# Patient Record
Sex: Female | Born: 1990 | Race: Black or African American | Hispanic: No | Marital: Single | Smoking: Former smoker
Health system: Southern US, Community
[De-identification: ages and names within clinical notes are randomized; demographics above are authoritative.]

## PROBLEM LIST (undated history)

## (undated) DIAGNOSIS — N83209 Unspecified ovarian cyst, unspecified side: Secondary | ICD-10-CM

## (undated) DIAGNOSIS — N760 Acute vaginitis: Secondary | ICD-10-CM

## (undated) DIAGNOSIS — B9689 Other specified bacterial agents as the cause of diseases classified elsewhere: Secondary | ICD-10-CM

## (undated) DIAGNOSIS — K219 Gastro-esophageal reflux disease without esophagitis: Secondary | ICD-10-CM

## (undated) HISTORY — PX: ESOPHAGEAL DILATION: SHX303

---

## 2012-05-25 ENCOUNTER — Encounter (HOSPITAL_COMMUNITY): Payer: Self-pay | Admitting: *Deleted

## 2012-05-25 ENCOUNTER — Emergency Department (HOSPITAL_COMMUNITY)
Admission: EM | Admit: 2012-05-25 | Discharge: 2012-05-25 | Disposition: A | Discharge status: Home / Self Care | Payer: Self-pay | Attending: Emergency Medicine | Admitting: Emergency Medicine

## 2012-05-25 DIAGNOSIS — F17200 Nicotine dependence, unspecified, uncomplicated: Secondary | ICD-10-CM | POA: Insufficient documentation

## 2012-05-25 DIAGNOSIS — Z3202 Encounter for pregnancy test, result negative: Secondary | ICD-10-CM | POA: Insufficient documentation

## 2012-05-25 DIAGNOSIS — R112 Nausea with vomiting, unspecified: Secondary | ICD-10-CM | POA: Insufficient documentation

## 2012-05-25 DIAGNOSIS — R51 Headache: Secondary | ICD-10-CM | POA: Insufficient documentation

## 2012-05-25 DIAGNOSIS — Z8719 Personal history of other diseases of the digestive system: Secondary | ICD-10-CM | POA: Insufficient documentation

## 2012-05-25 HISTORY — DX: Gastro-esophageal reflux disease without esophagitis: K21.9

## 2012-05-25 LAB — URINALYSIS, ROUTINE W REFLEX MICROSCOPIC
Nitrite: NEGATIVE
Protein, ur: NEGATIVE mg/dL
Specific Gravity, Urine: 1.025 (ref 1.005–1.030)
Urobilinogen, UA: 0.2 mg/dL (ref 0.0–1.0)

## 2012-05-25 LAB — POCT PREGNANCY, URINE: Preg Test, Ur: NEGATIVE

## 2012-05-25 LAB — POCT I-STAT, CHEM 8
Calcium, Ion: 1.19 mmol/L (ref 1.12–1.23)
Hemoglobin: 12.6 g/dL (ref 12.0–15.0)
Sodium: 143 mEq/L (ref 135–145)
TCO2: 29 mmol/L (ref 0–100)

## 2012-05-25 MED ORDER — PROMETHAZINE HCL 50 MG PO TABS: 50.0000 mg | ORAL_TABLET | Freq: Four times a day (QID) | ORAL | Status: DC | PRN

## 2012-05-25 MED ORDER — ONDANSETRON 4 MG PO TBDP
4.0000 mg | ORAL_TABLET | Freq: Once | ORAL | Status: AC
  Administered 2012-05-25: 4 mg via ORAL
  Filled 2012-05-25: qty 1

## 2012-05-25 MED ORDER — MORPHINE SULFATE 4 MG/ML IJ SOLN
4.0000 mg | Freq: Once | INTRAMUSCULAR | Status: AC
  Administered 2012-05-25: 4 mg via INTRAVENOUS
  Filled 2012-05-25: qty 1

## 2012-05-25 MED ORDER — SODIUM CHLORIDE 0.9 % IV BOLUS (SEPSIS)
1000.0000 mL | Freq: Once | INTRAVENOUS | Status: AC
  Administered 2012-05-25: 1000 mL via INTRAVENOUS

## 2012-05-25 MED ORDER — ONDANSETRON 4 MG PO TBDP
8.0000 mg | ORAL_TABLET | Freq: Once | ORAL | Status: AC
  Administered 2012-05-25: 8 mg via ORAL
  Filled 2012-05-25: qty 2

## 2012-05-25 NOTE — ED Notes (Signed)
Nicole PA at bedside   

## 2012-05-25 NOTE — ED Provider Notes (Signed)
History     CSN: [id number]  Arrival date & time 05/25/12  1952   First MD Initiated Contact with Patient 05/25/12 2204      Chief Complaint  Patient presents with  . Emesis    (Consider location/radiation/quality/duration/timing/severity/associated sxs/prior treatment) HPI  Bonnie Stevenson is a 22 y.o. female complaining of 3 episodes of nonbloody, nonbilious, non-coffee ground appearing emesis starting at 3 PM today. Patient went to eat at a [place] buffet in both her and her friend has had nausea and vomiting since that time. Patient denies abdominal pain, fever, diarrhea. She does endorse a mild to moderate left frontal headache. Patient has had fluids to drink since last episode of has been able to keep them down.   Past Medical History  Diagnosis Date  . GERD (gastroesophageal reflux disease)     Past Surgical History  Procedure Laterality Date  . Esophageal dilation      History reviewed. No pertinent family history.  History  Substance Use Topics  . Smoking status: Current Every Day Smoker -- 0.50 packs/day    Types: Cigarettes  . Smokeless tobacco: Not on file  . Alcohol Use: Yes    OB History   Grav Para Term Preterm Abortions TAB SAB Ect Mult Living                  Review of Systems  Constitutional: Negative for fever.  Respiratory: Negative for shortness of breath.   Cardiovascular: Negative for chest pain.  Gastrointestinal: Positive for nausea and vomiting. Negative for abdominal pain and diarrhea.  Neurological: Positive for headaches.  All other systems reviewed and are negative.    Allergies  Other  Home Medications  No current outpatient prescriptions on file.  BP 124/66  Pulse 83  Temp(Src) 98.3 F (36.8 C) (Oral)  Resp 16  SpO2 100%  LMP 05/23/2012  Physical Exam  Nursing note and vitals reviewed. Constitutional: She is oriented to person, place, and time. She appears well-developed and well-nourished. No distress.   HENT:  Head: Normocephalic.  Mouth/Throat: Oropharynx is clear and moist.  Eyes: Conjunctivae and EOM are normal. Pupils are equal, round, and reactive to light.  Cardiovascular: Normal rate, regular rhythm and intact distal pulses.   Pulmonary/Chest: Effort normal and breath sounds normal. No stridor. No respiratory distress. She has no wheezes. She has no rales. She exhibits no tenderness.  Abdominal: Soft. Bowel sounds are normal. She exhibits no distension and no mass. There is no tenderness. There is no rebound.  Musculoskeletal: Normal range of motion.  Neurological: She is alert and oriented to person, place, and time.  Psychiatric: She has a normal mood and affect.    ED Course  Procedures (including critical care time)  Labs Reviewed  URINALYSIS, ROUTINE W REFLEX MICROSCOPIC  POCT PREGNANCY, URINE  POCT I-STAT, CHEM 8   No results found.   1. Nausea and vomiting       MDM    Bonnie Stevenson is a 22 y.o. female several episodes of isolated emesis. Likely food poisoning as patient's friend who also died with her has similar symptoms. Abdominal exam is benign with no tenderness to palpation, guarding or rebound Patient is tolerating by mouth at this time.  I-STAT shows no electrolyte abnormalities. Patient will be rehydrated and pain control.   Filed Vitals:   05/25/12 2007  BP: 124/66  Pulse: 83  Temp: 98.3 F (36.8 C)  TempSrc: Oral  Resp: 16  SpO2: 100%  Pt verbalized understanding and agrees with care plan. Outpatient follow-up and return precautions given.    New Prescriptions   PROMETHAZINE (PHENERGAN) 50 MG TABLET    Take 1 tablet (50 mg total) by mouth every 6 (six) hours as needed for nausea.           Wynetta Emery, PA-C 05/25/12 2332

## 2012-05-25 NOTE — ED Notes (Signed)
Pt states she ate some chinese today from a buffet, since has had n/v.  Vomited approx 3 times since 3 pm.  States she has GERD, but this is different.  Worried about food poisoning.

## 2012-05-25 NOTE — ED Provider Notes (Signed)
Medical screening examination/treatment/procedure(s) were performed by non-physician practitioner and as supervising physician I was immediately available for consultation/collaboration.  Ethelda Chick, MD 05/25/12 [phone]

## 2012-05-25 NOTE — ED Notes (Addendum)
Pt states she went out to eat at a chinese buffet and began having nausea/vomiting and headache shortly afterwards. Pt states she has some sharp pains to left side as well but unsure if it was menstrual pain or pain from other source. Rates headache 6/10.

## 2012-06-06 ENCOUNTER — Encounter (HOSPITAL_COMMUNITY): Payer: Self-pay | Admitting: *Deleted

## 2012-06-06 ENCOUNTER — Emergency Department (HOSPITAL_COMMUNITY)
Admission: EM | Admit: 2012-06-06 | Discharge: 2012-06-06 | Disposition: A | Discharge status: Home / Self Care | Payer: Self-pay | Attending: Emergency Medicine | Admitting: Emergency Medicine

## 2012-06-06 DIAGNOSIS — L309 Dermatitis, unspecified: Secondary | ICD-10-CM

## 2012-06-06 DIAGNOSIS — L259 Unspecified contact dermatitis, unspecified cause: Secondary | ICD-10-CM | POA: Insufficient documentation

## 2012-06-06 DIAGNOSIS — Z8719 Personal history of other diseases of the digestive system: Secondary | ICD-10-CM | POA: Insufficient documentation

## 2012-06-06 DIAGNOSIS — F17200 Nicotine dependence, unspecified, uncomplicated: Secondary | ICD-10-CM | POA: Insufficient documentation

## 2012-06-06 NOTE — ED Provider Notes (Signed)
History    This chart was scribed for non-physician practitioner Jaci Carrel, PA-C working with Raeford Razor, MD by Gerlean Ren, ED Scribe. This patient was seen in room TR09C/TR09C and the patient's care was started at .   CSN: [id number]  Arrival date & time 06/06/12  1908   None     Chief Complaint  Patient presents with  . Rash     The history is provided by the patient. No language interpreter was used.  Bonnie Stevenson is a 22 y.o. female who presents to the Emergency Department complaining of an itching rash over her left side face that began yesterday.  Pt reports she used new soap for the first time yesterday.  No other new exposures, soaps.   Past Medical History  Diagnosis Date  . GERD (gastroesophageal reflux disease)     Past Surgical History  Procedure Laterality Date  . Esophageal dilation      No family history on file.  History  Substance Use Topics  . Smoking status: Current Every Day Smoker -- 0.50 packs/day    Types: Cigarettes  . Smokeless tobacco: Not on file  . Alcohol Use: Yes    No OB history provided.   Review of Systems See HPI. Allergies  Other  Home Medications  No current outpatient prescriptions on file.  BP 117/57  Temp(Src) 98.4 F (36.9 C) (Oral)  Resp 18  SpO2 98%  LMP 05/23/2012  Physical Exam  Nursing note and vitals reviewed. Constitutional: She is oriented to person, place, and time. She appears well-developed and well-nourished. No distress.  HENT:  Head: Normocephalic and atraumatic.  Eyes: Conjunctivae and EOM are normal.  Neck: Normal range of motion.  Pulmonary/Chest: Effort normal.  Musculoskeletal: Normal range of motion.  Neurological: She is alert and oriented to person, place, and time.  Skin: Skin is warm and dry. Rash noted. She is not diaphoretic.  Small mild superficial bumps on face bilaterally. No crusting, weeping, or erythema  Psychiatric: She has a normal mood and affect. Her behavior  is normal.    ED Course  Procedures (including critical care time) DIAGNOSTIC STUDIES: Oxygen Saturation is 98% on room air, normal by my interpretation.    COORDINATION OF CARE: 9:27 PM- Informed pt to treat rash with oatmeal mask at home and to avoid using most recent soap again.  Pt understands and agrees with plan.      No diagnosis found.    MDM  dermatitis Pt appears non-toxic, VS normal.  Pt presents with itching rash over  face that began shortly after using new soap.  Rash has not spread.  No associated symptoms.  Advised pt to use oatmeal mask and to avoid the soap used yesterday.  Discussed strict return precautions.  Pt verbalizes understanding and agrees with plan.   I personally performed the services described in this documentation, which was scribed in my presence. The recorded information has been reviewed and is accurate.        Jaci Carrel, [place] 06/06/12 2150

## 2012-06-06 NOTE — ED Notes (Signed)
Rash on her face since yesterday.  She wants a note for work

## 2012-06-06 NOTE — ED Notes (Signed)
PT comfortable with d/c instructions. Pt requested work note for today, given with permission from [place]

## 2012-06-13 NOTE — ED Provider Notes (Signed)
Medical screening examination/treatment/procedure(s) were performed by non-physician practitioner and as supervising physician I was immediately available for consultation/collaboration.  Nicle Connole, MD 06/13/12 0811 

## 2013-06-21 ENCOUNTER — Encounter (HOSPITAL_COMMUNITY): Payer: Self-pay | Admitting: Emergency Medicine

## 2013-06-21 ENCOUNTER — Emergency Department (HOSPITAL_COMMUNITY)
Admission: EM | Admit: 2013-06-21 | Discharge: 2013-06-21 | Disposition: A | Discharge status: Home / Self Care | Payer: Self-pay | Attending: Emergency Medicine | Admitting: Emergency Medicine

## 2013-06-21 DIAGNOSIS — F17200 Nicotine dependence, unspecified, uncomplicated: Secondary | ICD-10-CM | POA: Insufficient documentation

## 2013-06-21 DIAGNOSIS — Z8719 Personal history of other diseases of the digestive system: Secondary | ICD-10-CM | POA: Insufficient documentation

## 2013-06-21 DIAGNOSIS — R51 Headache: Secondary | ICD-10-CM | POA: Insufficient documentation

## 2013-06-21 DIAGNOSIS — K644 Residual hemorrhoidal skin tags: Secondary | ICD-10-CM | POA: Insufficient documentation

## 2013-06-21 DIAGNOSIS — Z7982 Long term (current) use of aspirin: Secondary | ICD-10-CM | POA: Insufficient documentation

## 2013-06-21 DIAGNOSIS — Z3202 Encounter for pregnancy test, result negative: Secondary | ICD-10-CM | POA: Insufficient documentation

## 2013-06-21 DIAGNOSIS — IMO0001 Reserved for inherently not codable concepts without codable children: Secondary | ICD-10-CM | POA: Insufficient documentation

## 2013-06-21 DIAGNOSIS — R197 Diarrhea, unspecified: Secondary | ICD-10-CM | POA: Insufficient documentation

## 2013-06-21 DIAGNOSIS — R112 Nausea with vomiting, unspecified: Secondary | ICD-10-CM | POA: Insufficient documentation

## 2013-06-21 LAB — POC URINE PREG, ED: Preg Test, Ur: NEGATIVE

## 2013-06-21 LAB — COMPREHENSIVE METABOLIC PANEL
ALT: 21 U/L (ref 0–35)
AST: 23 U/L (ref 0–37)
Albumin: 4.3 g/dL (ref 3.5–5.2)
Alkaline Phosphatase: 102 U/L (ref 39–117)
BUN: 14 mg/dL (ref 6–23)
CHLORIDE: 104 meq/L (ref 96–112)
CO2: 25 meq/L (ref 19–32)
CREATININE: 0.78 mg/dL (ref 0.50–1.10)
Calcium: 9.8 mg/dL (ref 8.4–10.5)
GFR calc Af Amer: >90 mL/min (ref >90)
Glucose, Bld: 91 mg/dL (ref 70–99)
Potassium: 3.9 mEq/L (ref 3.7–5.3)
Sodium: 142 mEq/L (ref 137–147)
Total Protein: 7.8 g/dL (ref 6.0–8.3)

## 2013-06-21 LAB — CBC WITH DIFFERENTIAL/PLATELET
BASOS PCT: 0 % (ref 0–1)
Basophils Absolute: 0 10*3/uL (ref 0.0–0.1)
Eosinophils Absolute: 0.1 10*3/uL (ref 0.0–0.7)
Eosinophils Relative: 2 % (ref 0–5)
HEMATOCRIT: 32.4 % — AB (ref 36.0–46.0)
HEMOGLOBIN: 10.3 g/dL — AB (ref 12.0–15.0)
Lymphocytes Relative: 34 % (ref 12–46)
Lymphs Abs: 1.6 10*3/uL (ref 0.7–4.0)
MCH: 25.6 pg — ABNORMAL LOW (ref 26.0–34.0)
MCHC: 31.8 g/dL (ref 30.0–36.0)
MCV: 80.6 fL (ref 78.0–100.0)
MONO ABS: 0.4 10*3/uL (ref 0.1–1.0)
MONOS PCT: 8 % (ref 3–12)
NEUTROS ABS: 2.7 10*3/uL (ref 1.7–7.7)
Neutrophils Relative %: 56 % (ref 43–77)
Platelets: 363 10*3/uL (ref 150–400)
RBC: 4.02 MIL/uL (ref 3.87–5.11)
RDW: 16.1 % — ABNORMAL HIGH (ref 11.5–15.5)
WBC: 4.8 10*3/uL (ref 4.0–10.5)

## 2013-06-21 LAB — URINE MICROSCOPIC-ADD ON

## 2013-06-21 LAB — URINALYSIS, ROUTINE W REFLEX MICROSCOPIC
Bilirubin Urine: NEGATIVE
Glucose, UA: NEGATIVE mg/dL
Ketones, ur: NEGATIVE mg/dL
Nitrite: NEGATIVE
PROTEIN: NEGATIVE mg/dL
Specific Gravity, Urine: 1.024 (ref 1.005–1.030)
UROBILINOGEN UA: 1 mg/dL (ref 0.0–1.0)
pH: 6 (ref 5.0–8.0)

## 2013-06-21 MED ORDER — ONDANSETRON HCL 4 MG PO TABS: 4.0000 mg | ORAL_TABLET | Freq: Four times a day (QID) | ORAL | Status: DC

## 2013-06-21 MED ORDER — HYDROCORTISONE 2.5 % RE CREA: TOPICAL_CREAM | RECTAL | Status: DC

## 2013-06-21 NOTE — ED Notes (Signed)
Pt reports that she ate a pie this am after she vomited it stayed down

## 2013-06-21 NOTE — ED Provider Notes (Signed)
CSN: [id number]     Arrival date & time 06/21/13  1000 History   First MD Initiated Contact with Patient 06/21/13 1048     Chief Complaint  Patient presents with  . Nausea    3 day hx of NVD  . Emesis  . Diarrhea     (Consider location/radiation/quality/duration/timing/severity/associated sxs/prior Treatment) Patient is a 23 y.o. female presenting with diarrhea. The history is provided by the patient. No language interpreter was used.  Diarrhea Quality:  Semi-solid Severity:  Moderate Onset quality:  Unable to specify Number of episodes:  5 Duration:  3 days Timing:  Sporadic Progression:  Unchanged Relieved by:  Nothing Worsened by:  Nothing tried Ineffective treatments:  None tried Associated symptoms: headaches, myalgias and vomiting   Associated symptoms: no abdominal pain, no arthralgias, no chills, no recent cough, no diaphoresis and no fever   Myalgias:    Location:  Generalized   Quality:  Aching Vomiting:    Quality:  Stomach contents   Severity:  Mild   Timing:  Intermittent   Progression:  Improving Risk factors: no recent antibiotic use, no sick contacts, no suspicious food intake and no travel to endemic areas     Past Medical History  Diagnosis Date  . GERD (gastroesophageal reflux disease)    Past Surgical History  Procedure Laterality Date  . Esophageal dilation     Family History  Problem Relation Age of Onset  . Hypertension Other    History  Substance Use Topics  . Smoking status: Current Every Day Smoker -- 0.50 packs/day    Types: Cigarettes  . Smokeless tobacco: Not on file  . Alcohol Use: Yes   OB History   Grav Para Term Preterm Abortions TAB SAB Ect Mult Living                 Review of Systems  Constitutional: Negative for fever, chills, diaphoresis, activity change, appetite change and fatigue.  HENT: Negative for congestion, facial swelling, rhinorrhea and sore throat.   Eyes: Negative for photophobia and discharge.    Respiratory: Negative for cough, chest tightness and shortness of breath.   Cardiovascular: Negative for chest pain, palpitations and leg swelling.  Gastrointestinal: Positive for vomiting and diarrhea. Negative for nausea and abdominal pain.  Endocrine: Negative for polydipsia and polyuria.  Genitourinary: Negative for dysuria, frequency, difficulty urinating and pelvic pain.  Musculoskeletal: Positive for myalgias. Negative for arthralgias, back pain, neck pain and neck stiffness.  Skin: Negative for color change and wound.  Allergic/Immunologic: Negative for immunocompromised state.  Neurological: Positive for headaches. Negative for facial asymmetry, weakness and numbness.  Hematological: Does not bruise/bleed easily.  Psychiatric/Behavioral: Negative for confusion and agitation.      Allergies  Other  Home Medications   Prior to Admission medications   Medication Sig Start Date End Date Taking? Authorizing Provider  aspirin 81 MG chewable tablet Chew 405 mg by mouth as needed for headache.   Yes Historical Provider, MD  ibuprofen (ADVIL,MOTRIN) 200 MG tablet Take 400 mg by mouth every 6 (six) hours as needed for headache.   Yes Historical Provider, MD  hydrocortisone (ANUSOL-HC) 2.5 % rectal cream Apply rectally 2 times daily 06/21/13   Shanna CiscoMegan E Chrysten Woulfe, MD   BP 124/68  Pulse 76  Temp(Src) 97.9 F (36.6 C) (Oral)  Resp 16  Wt 170 lb (77.111 kg)  SpO2 100%  LMP 06/21/2013 Physical Exam  Constitutional: She is oriented to person, place, and time. She appears well-developed and  well-nourished. No distress.  HENT:  Head: Normocephalic and atraumatic.  Mouth/Throat: No oropharyngeal exudate.  Eyes: Pupils are equal, round, and reactive to light.  Neck: Normal range of motion. Neck supple.  Cardiovascular: Normal rate, regular rhythm and normal heart sounds.  Exam reveals no gallop and no friction rub.   No murmur heard. Pulmonary/Chest: Effort normal and breath sounds  normal. No respiratory distress. She has no wheezes. She has no rales.  Abdominal: Soft. Bowel sounds are normal. She exhibits no distension and no mass. There is no tenderness. There is no rebound and no guarding.  Genitourinary:     Musculoskeletal: Normal range of motion. She exhibits no edema and no tenderness.  Neurological: She is alert and oriented to person, place, and time.  Skin: Skin is warm and dry.  Psychiatric: She has a normal mood and affect.    ED Course  Procedures (including critical care time) Labs Review Labs Reviewed  CBC WITH DIFFERENTIAL - Abnormal; Notable for the following:    Hemoglobin 10.3 (*)    HCT 32.4 (*)    MCH 25.6 (*)    RDW 16.1 (*)    All other components within normal limits  COMPREHENSIVE METABOLIC PANEL - Abnormal; Notable for the following:    Total Bilirubin <0.2 (*)    All other components within normal limits  URINALYSIS, ROUTINE W REFLEX MICROSCOPIC - Abnormal; Notable for the following:    APPearance CLOUDY (*)    Hgb urine dipstick TRACE (*)    Leukocytes, UA SMALL (*)    All other components within normal limits  URINE MICROSCOPIC-ADD ON - Abnormal; Notable for the following:    Bacteria, UA MANY (*)    All other components within normal limits  URINE CULTURE  POC URINE PREG, ED    Imaging Review No results found.   EKG Interpretation None      MDM   Final diagnoses:  Diarrhea    Pt is a 23 y.o. female with Pmhx as above who presents with 3 days of n/v/d myalgias and exacerbation of external hemorrhoids. No urinary symptoms. She denies fever, has been able to tolerate PO, has rare ab pain. On PE, VSS, pt in NAD. Abdominal exam benign. CMP unremarkable. WBC nml. Suspect viral gastro. Will rec supportive care w/ PO hydration, zofran, anusol for hemorrhoids. Return precautions given for new or worsening symptoms including worsening pain, fever, inability to tolerate PO, bloody stool.          Shanna CiscoMegan E Kurtiss Wence,  MD 06/21/13 1910

## 2013-06-21 NOTE — Discharge Instructions (Signed)
Diarrhea Diarrhea is frequent loose and watery bowel movements. It can cause you to feel weak and dehydrated. Dehydration can cause you to become tired and thirsty, have a dry mouth, and have decreased urination that often is dark yellow. Diarrhea is a sign of another problem, most often an infection that will not last long. In most cases, diarrhea typically lasts 2 3 days. However, it can last longer if it is a sign of something more serious. It is important to treat your diarrhea as directed by your caregive to lessen or prevent future episodes of diarrhea. CAUSES  Some common causes include:  Gastrointestinal infections caused by viruses, bacteria, or parasites.  Food poisoning or food allergies.  Certain medicines, such as antibiotics, chemotherapy, and laxatives.  Artificial sweeteners and fructose.  Digestive disorders. HOME CARE INSTRUCTIONS  Ensure adequate fluid intake (hydration): have 1 cup (8 oz) of fluid for each diarrhea episode. Avoid fluids that contain simple sugars or sports drinks, fruit juices, whole milk products, and sodas. Your urine should be clear or pale yellow if you are drinking enough fluids. Hydrate with an oral rehydration solution that you can purchase at pharmacies, retail stores, and online. You can prepare an oral rehydration solution at home by mixing the following ingredients together:    tsp table salt.   tsp baking soda.   tsp salt substitute containing potassium chloride.  1  tablespoons sugar.  1 L (34 oz) of water.  Certain foods and beverages may increase the speed at which food moves through the gastrointestinal (GI) tract. These foods and beverages should be avoided and include:  Caffeinated and alcoholic beverages.  High-fiber foods, such as raw fruits and vegetables, nuts, seeds, and whole grain breads and cereals.  Foods and beverages sweetened with sugar alcohols, such as xylitol, sorbitol, and mannitol.  Some foods may be well  tolerated and may help thicken stool including:  Starchy foods, such as rice, toast, pasta, low-sugar cereal, oatmeal, grits, baked potatoes, crackers, and bagels.  Bananas.  Applesauce.  Add probiotic-rich foods to help increase healthy bacteria in the GI tract, such as yogurt and fermented milk products.  Wash your hands well after each diarrhea episode.  Only take over-the-counter or prescription medicines as directed by your caregiver.  Take a warm bath to relieve any burning or pain from frequent diarrhea episodes. SEEK IMMEDIATE MEDICAL CARE IF:   You are unable to keep fluids down.  You have persistent vomiting.  You have blood in your stool, or your stools are black and tarry.  You do not urinate in 6 8 hours, or there is only a small amount of very dark urine.  You have abdominal pain that increases or localizes.  You have weakness, dizziness, confusion, or lightheadedness.  You have a severe headache.  Your diarrhea gets worse or does not get better.  You have a fever or persistent symptoms for more than 2 3 days.  You have a fever and your symptoms suddenly get worse. MAKE SURE YOU:   Understand these instructions.  Will watch your condition.  Will get help right away if you are not doing well or get worse. Document Released: 02/05/2002 Document Revised: 02/02/2012 Document Reviewed: 10/24/2011 [employer] Patient Information 2014 [employer].  

## 2013-06-21 NOTE — ED Notes (Signed)
Pt reports 3 day hx NVD. Treated with ASA, Motrin. C/o headache for a few days

## 2013-06-21 NOTE — Progress Notes (Signed)
[facility] provided pt with a list of primary care resources and a [facility] Orange Card application to help patient establish primary care.  °

## 2013-06-22 LAB — URINE CULTURE: Colony Count: 30000

## 2014-01-09 ENCOUNTER — Emergency Department (HOSPITAL_COMMUNITY)
Admission: EM | Admit: 2014-01-09 | Discharge: 2014-01-09 | Disposition: A | Discharge status: Home / Self Care | Payer: Self-pay | Attending: Emergency Medicine | Admitting: Emergency Medicine

## 2014-01-09 ENCOUNTER — Encounter (HOSPITAL_COMMUNITY): Payer: Self-pay | Admitting: Emergency Medicine

## 2014-01-09 DIAGNOSIS — Z7982 Long term (current) use of aspirin: Secondary | ICD-10-CM | POA: Insufficient documentation

## 2014-01-09 DIAGNOSIS — Z3202 Encounter for pregnancy test, result negative: Secondary | ICD-10-CM | POA: Insufficient documentation

## 2014-01-09 DIAGNOSIS — Z7952 Long term (current) use of systemic steroids: Secondary | ICD-10-CM | POA: Insufficient documentation

## 2014-01-09 DIAGNOSIS — Z8719 Personal history of other diseases of the digestive system: Secondary | ICD-10-CM | POA: Insufficient documentation

## 2014-01-09 DIAGNOSIS — Z720 Tobacco use: Secondary | ICD-10-CM | POA: Insufficient documentation

## 2014-01-09 DIAGNOSIS — N760 Acute vaginitis: Secondary | ICD-10-CM | POA: Insufficient documentation

## 2014-01-09 LAB — URINALYSIS, ROUTINE W REFLEX MICROSCOPIC
Bilirubin Urine: NEGATIVE
GLUCOSE, UA: NEGATIVE mg/dL
Hgb urine dipstick: NEGATIVE
KETONES UR: NEGATIVE mg/dL
LEUKOCYTES UA: NEGATIVE
NITRITE: NEGATIVE
PROTEIN: NEGATIVE mg/dL
Specific Gravity, Urine: 1.03 (ref 1.005–1.030)
Urobilinogen, UA: 0.2 mg/dL (ref 0.0–1.0)
pH: 6 (ref 5.0–8.0)

## 2014-01-09 LAB — POC URINE PREG, ED: Preg Test, Ur: NEGATIVE

## 2014-01-09 LAB — WET PREP, GENITAL
Clue Cells Wet Prep HPF POC: NONE SEEN
Trich, Wet Prep: NONE SEEN
WBC WET PREP: NONE SEEN
YEAST WET PREP: NONE SEEN

## 2014-01-09 MED ORDER — FLUCONAZOLE 150 MG PO TABS
150.0000 mg | ORAL_TABLET | Freq: Once | ORAL | Status: AC
  Administered 2014-01-09: 150 mg via ORAL
  Filled 2014-01-09: qty 1

## 2014-01-09 NOTE — Discharge Instructions (Signed)
Avoid using any products except for water for cleaning of your perineum. Follow up with your doctor as needed. Your cultures still pending. You will contacted.   Vaginitis Vaginitis is an inflammation of the vagina. It is most often caused by a change in the normal balance of the bacteria and yeast that live in the vagina. This change in balance causes an overgrowth of certain bacteria or yeast, which causes the inflammation. There are different types of vaginitis, but the most common types are:  Bacterial vaginosis.  Yeast infection (candidiasis).  Trichomoniasis vaginitis. This is a sexually transmitted infection (STI).  Viral vaginitis.  Atropic vaginitis.  Allergic vaginitis. CAUSES  The cause depends on the type of vaginitis. Vaginitis can be caused by:  Bacteria (bacterial vaginosis).  Yeast (yeast infection).  A parasite (trichomoniasis vaginitis)  A virus (viral vaginitis).  Low hormone levels (atrophic vaginitis). Low hormone levels can occur during pregnancy, breastfeeding, or after menopause.  Irritants, such as bubble baths, scented tampons, and feminine sprays (allergic vaginitis). Other factors can change the normal balance of the yeast and bacteria that live in the vagina. These include:  Antibiotic medicines.  Poor hygiene.  Diaphragms, vaginal sponges, spermicides, birth control pills, and intrauterine devices (IUD).  Sexual intercourse.  Infection.  Uncontrolled diabetes.  A weakened immune system. SYMPTOMS  Symptoms can vary depending on the cause of the vaginitis. Common symptoms include:  Abnormal vaginal discharge.  The discharge is white, gray, or yellow with bacterial vaginosis.  The discharge is thick, white, and cheesy with a yeast infection.  The discharge is frothy and yellow or greenish with trichomoniasis.  A bad vaginal odor.  The odor is fishy with bacterial vaginosis.  Vaginal itching, pain, or swelling.  Painful  intercourse.  Pain or burning when urinating. Sometimes, there are no symptoms. TREATMENT  Treatment will vary depending on the type of infection.   Bacterial vaginosis and trichomoniasis are often treated with antibiotic creams or pills.  Yeast infections are often treated with antifungal medicines, such as vaginal creams or suppositories.  Viral vaginitis has no cure, but symptoms can be treated with medicines that relieve discomfort. Your sexual partner should be treated as well.  Atrophic vaginitis may be treated with an estrogen cream, pill, suppository, or vaginal ring. If vaginal dryness occurs, lubricants and moisturizing creams may help. You may be told to avoid scented soaps, sprays, or douches.  Allergic vaginitis treatment involves quitting the use of the product that is causing the problem. Vaginal creams can be used to treat the symptoms. HOME CARE INSTRUCTIONS   Take all medicines as directed by your caregiver.  Keep your genital area clean and dry. Avoid soap and only rinse the area with water.  Avoid douching. It can remove the healthy bacteria in the vagina.  Do not use tampons or have sexual intercourse until your vaginitis has been treated. Use sanitary pads while you have vaginitis.  Wipe from front to back. This avoids the spread of bacteria from the rectum to the vagina.  Let air reach your genital area.  Wear cotton underwear to decrease moisture buildup.  Avoid wearing underwear while you sleep until your vaginitis is gone.  Avoid tight pants and underwear or nylons without a cotton panel.  Take off wet clothing (especially bathing suits) as soon as possible.  Use mild, non-scented products. Avoid using irritants, such as:  Scented feminine sprays.  Fabric softeners.  Scented detergents.  Scented tampons.  Scented soaps or bubble baths.  Practice safe sex and use condoms. Condoms may prevent the spread of trichomoniasis and viral  vaginitis. SEEK MEDICAL CARE IF:   You have abdominal pain.  You have a fever or persistent symptoms for more than 2-3 days.  You have a fever and your symptoms suddenly get worse. Document Released: 12/13/2006 Document Revised: 11/10/2011 Document Reviewed: 07/29/2011 [facility] Patient Information 2015 [place], [place]. This information is not intended to replace advice given to you by your health care provider. Make sure you discuss any questions you have with your health care provider.

## 2014-01-09 NOTE — ED Notes (Signed)
Pt states she is here for STD check. Pt reports yellowish vaginal discharge and vaginal pain x 2 days. Denies urinary sx.

## 2014-01-09 NOTE — ED Provider Notes (Signed)
CSN: [id number]     Arrival date & time 01/09/14  1759 History   First MD Initiated Contact with Patient 01/09/14 1847     Chief Complaint  Patient presents with  . Vaginal Discharge     (Consider location/radiation/quality/duration/timing/severity/associated sxs/prior Treatment) HPI Bonnie Stevenson is a 23 y.o. female who presents to ED with complaint of vaginal discharge. Pt states she has had discharge for about 2 days. States it is thick, white, malodorous. No fever, chills. No abdominal pain. No urinary symptoms. No pain with intercourse. Denies n/v/d. Nothing making symptoms better or worse.   Past Medical History  Diagnosis Date  . GERD (gastroesophageal reflux disease)    Past Surgical History  Procedure Laterality Date  . Esophageal dilation     Family History  Problem Relation Age of Onset  . Hypertension Other    History  Substance Use Topics  . Smoking status: Current Every Day Smoker -- 0.50 packs/day    Types: Cigarettes  . Smokeless tobacco: Not on file  . Alcohol Use: Yes   OB History    No data available     Review of Systems  Constitutional: Negative for fever and chills.  Respiratory: Negative for cough, chest tightness and shortness of breath.   Cardiovascular: Negative for chest pain, palpitations and leg swelling.  Gastrointestinal: Negative for nausea, vomiting, abdominal pain and diarrhea.  Genitourinary: Positive for vaginal discharge. Negative for dysuria, urgency, flank pain, vaginal bleeding, vaginal pain and pelvic pain.  Musculoskeletal: Negative for myalgias, arthralgias, neck pain and neck stiffness.  Skin: Negative for rash.  Neurological: Negative for dizziness, weakness and headaches.  All other systems reviewed and are negative.     Allergies  Other  Home Medications   Prior to Admission medications   Medication Sig Start Date End Date Taking? Authorizing Provider  aspirin 81 MG chewable tablet Chew 405 mg by mouth  as needed for headache.    Historical Provider, MD  hydrocortisone (ANUSOL-HC) 2.5 % rectal cream Apply rectally 2 times daily 06/21/13   Toy CookeyMegan Docherty, MD  ibuprofen (ADVIL,MOTRIN) 200 MG tablet Take 400 mg by mouth every 6 (six) hours as needed for headache.    Historical Provider, MD  ondansetron (ZOFRAN) 4 MG tablet Take 1 tablet (4 mg total) by mouth every 6 (six) hours. 06/21/13   Toy CookeyMegan Docherty, MD   BP 134/73 mmHg  Pulse 90  Temp(Src) 97.6 F (36.4 C) (Oral)  Resp 16  SpO2 100%  LMP 12/19/2013 (Approximate) Physical Exam  Constitutional: She appears well-developed and well-nourished. No distress.  HENT:  Head: Normocephalic.  Eyes: Conjunctivae are normal.  Neck: Neck supple.  Cardiovascular: Normal rate, regular rhythm and normal heart sounds.   Pulmonary/Chest: Effort normal and breath sounds normal. No respiratory distress. She has no wheezes. She has no rales.  Abdominal: Soft. Bowel sounds are normal. She exhibits no distension. There is no tenderness. There is no rebound.  Genitourinary:  Normal external genitalia. Normal vaginal canal. Small thin white discharge. Cervix is normal, closed. No CMT. No uterine or adnexal tenderness. No masses palpated.    Musculoskeletal: She exhibits no edema.  Neurological: She is alert.  Skin: Skin is warm and dry.  Psychiatric: She has a normal mood and affect. Her behavior is normal.  Nursing note and vitals reviewed.   ED Course  Procedures (including critical care time) Labs Review Labs Reviewed  WET PREP, GENITAL  GC/CHLAMYDIA PROBE AMP  URINALYSIS, ROUTINE W REFLEX MICROSCOPIC  POC URINE  PREG, ED    Imaging Review No results found.   EKG Interpretation None      MDM   Final diagnoses:  Vaginitis    Patient is here with vaginal discharge and itching. Exam is unremarkable. Wet prep negative. I did treat her symptoms with Diflucan 150 mg by mouth 1. GC chlamydia cultures pending. She refused RPR and HIV. Stable  for discharge home.  Filed Vitals:   01/09/14 1804 01/09/14 1954  BP: 134/73 124/80  Pulse: 90 78  Temp: 97.6 F (36.4 C) 97.7 F (36.5 C)  TempSrc: Oral Oral  Resp: 16 18  SpO2: 100% 100%       Lottie Musselatyana A Zamyiah Tino, PA-C 01/09/14 2334  Rolland PorterMark James, MD 01/19/14 [phone]

## 2014-01-10 LAB — GC/CHLAMYDIA PROBE AMP
CT Probe RNA: NEGATIVE
GC Probe RNA: NEGATIVE

## 2014-01-31 ENCOUNTER — Encounter (HOSPITAL_COMMUNITY): Payer: Self-pay | Admitting: Emergency Medicine

## 2014-01-31 ENCOUNTER — Emergency Department (HOSPITAL_COMMUNITY)
Admission: EM | Admit: 2014-01-31 | Discharge: 2014-01-31 | Disposition: A | Discharge status: Home / Self Care | Payer: Self-pay | Attending: Emergency Medicine | Admitting: Emergency Medicine

## 2014-01-31 DIAGNOSIS — W57XXXA Bitten or stung by nonvenomous insect and other nonvenomous arthropods, initial encounter: Secondary | ICD-10-CM | POA: Insufficient documentation

## 2014-01-31 DIAGNOSIS — S30860A Insect bite (nonvenomous) of lower back and pelvis, initial encounter: Secondary | ICD-10-CM | POA: Insufficient documentation

## 2014-01-31 DIAGNOSIS — Z7982 Long term (current) use of aspirin: Secondary | ICD-10-CM | POA: Insufficient documentation

## 2014-01-31 DIAGNOSIS — Z7952 Long term (current) use of systemic steroids: Secondary | ICD-10-CM | POA: Insufficient documentation

## 2014-01-31 DIAGNOSIS — Z720 Tobacco use: Secondary | ICD-10-CM | POA: Insufficient documentation

## 2014-01-31 DIAGNOSIS — Y9389 Activity, other specified: Secondary | ICD-10-CM | POA: Insufficient documentation

## 2014-01-31 DIAGNOSIS — S20461A Insect bite (nonvenomous) of right back wall of thorax, initial encounter: Secondary | ICD-10-CM

## 2014-01-31 DIAGNOSIS — Y9289 Other specified places as the place of occurrence of the external cause: Secondary | ICD-10-CM | POA: Insufficient documentation

## 2014-01-31 DIAGNOSIS — Y998 Other external cause status: Secondary | ICD-10-CM | POA: Insufficient documentation

## 2014-01-31 DIAGNOSIS — Z8719 Personal history of other diseases of the digestive system: Secondary | ICD-10-CM | POA: Insufficient documentation

## 2014-01-31 MED ORDER — HYDROXYZINE HCL 25 MG PO TABS: 25.0000 mg | ORAL_TABLET | Freq: Four times a day (QID) | ORAL | Status: DC

## 2014-01-31 MED ORDER — TRIAMCINOLONE ACETONIDE 0.1 % EX CREA: 1.0000 "application " | TOPICAL_CREAM | Freq: Two times a day (BID) | CUTANEOUS | Status: DC

## 2014-01-31 NOTE — Discharge Instructions (Signed)

## 2014-01-31 NOTE — ED Provider Notes (Signed)
CSN: [id number]     Arrival date & time 01/31/14  1751 History  This chart was scribed for non-physician practitioner working with Toy CookeyMegan Docherty, MD by Richarda Overlieichard Holland, ED Scribe. This patient was seen in room WTR6/WTR6 and the patient's care was started at 6:25 PM.     Chief Complaint  Patient presents with  . Insect Bite   The history is provided by the patient. No language interpreter was used.   HPI Comments: Bonnie Stevenson is a 23 y.o. female who presents to the Emergency Department complaining of a worsening raised rash to her lower left back that occurred 2 days ago from a suspected bug bite. She reports associated chills and itchiness to the area. She states she did not see the bug. She reports she has taken no medications for her symptoms. Pt reports no modifying factors at this time.    Past Medical History  Diagnosis Date  . GERD (gastroesophageal reflux disease)    Past Surgical History  Procedure Laterality Date  . Esophageal dilation     Family History  Problem Relation Age of Onset  . Hypertension Other    History  Substance Use Topics  . Smoking status: Current Every Day Smoker -- 0.50 packs/day    Types: Cigarettes  . Smokeless tobacco: Not on file  . Alcohol Use: Yes   OB History    No data available     Review of Systems  Constitutional: Positive for chills.  Skin: Positive for rash.  All other systems reviewed and are negative.   Allergies  Other  Home Medications   Prior to Admission medications   Medication Sig Start Date End Date Taking? Authorizing Provider  aspirin 81 MG chewable tablet Chew 81 mg by mouth every 4 (four) hours as needed for headache.     Historical Provider, MD  hydrocortisone (ANUSOL-HC) 2.5 % rectal cream Apply rectally 2 times daily 06/21/13   Toy CookeyMegan Docherty, MD  ibuprofen (ADVIL,MOTRIN) 200 MG tablet Take 400 mg by mouth every 6 (six) hours as needed for headache.    Historical Provider, MD  ondansetron  (ZOFRAN) 4 MG tablet Take 1 tablet (4 mg total) by mouth every 6 (six) hours. 06/21/13   Toy CookeyMegan Docherty, MD   BP 137/93 mmHg  Pulse 88  Temp(Src) 97.7 F (36.5 C) (Oral)  Resp 16  SpO2 100%  LMP 01/14/2014 Physical Exam  Constitutional: She is oriented to person, place, and time. She appears well-developed and well-nourished.  HENT:  Head: Normocephalic and atraumatic.  Mouth/Throat: Oropharynx is clear and moist. No oropharyngeal exudate.  Throat clear.   Cardiovascular: Normal rate.   Pulmonary/Chest: Effort normal and breath sounds normal. No respiratory distress. She has no wheezes. She has no rales.  Abdominal: She exhibits no distension.  Neurological: She is alert and oriented to person, place, and time.  Skin: Skin is warm and dry.  12x10cm area of erythema left lower outer back.   Psychiatric: She has a normal mood and affect. Her behavior is normal.  Nursing note and vitals reviewed.   ED Course  Procedures  DIAGNOSTIC STUDIES: Oxygen Saturation is 100% on RA, normal by my interpretation.    COORDINATION OF CARE: 6:28 PM Discussed treatment plan with pt at bedside and pt agreed to plan.   Labs Review Labs Reviewed - No data to display  Imaging Review No results found.   EKG Interpretation None      MDM   Final diagnoses:  Nonvenomous insect bite  of right side of back without infection, initial encounter    Triamcinalone  atarax    Elson AreasLeslie K Jacquel Mccamish, PA-C 01/31/14 1939  Purvis SheffieldForrest Harrison, MD 02/01/14 [phone]

## 2014-01-31 NOTE — ED Notes (Signed)
Pt present with rasised rash to left lower back right above buttocks. Pt states noticed 2 days ago but has gotten worse.

## 2014-04-24 ENCOUNTER — Encounter (HOSPITAL_COMMUNITY): Payer: Self-pay | Admitting: Emergency Medicine

## 2014-04-24 ENCOUNTER — Emergency Department (HOSPITAL_COMMUNITY)
Admission: EM | Admit: 2014-04-24 | Discharge: 2014-04-24 | Disposition: A | Discharge status: Home / Self Care | Payer: Self-pay | Attending: Emergency Medicine | Admitting: Emergency Medicine

## 2014-04-24 DIAGNOSIS — L500 Allergic urticaria: Secondary | ICD-10-CM | POA: Insufficient documentation

## 2014-04-24 DIAGNOSIS — T7840XA Allergy, unspecified, initial encounter: Secondary | ICD-10-CM | POA: Insufficient documentation

## 2014-04-24 DIAGNOSIS — Z7952 Long term (current) use of systemic steroids: Secondary | ICD-10-CM | POA: Insufficient documentation

## 2014-04-24 DIAGNOSIS — Y9389 Activity, other specified: Secondary | ICD-10-CM | POA: Insufficient documentation

## 2014-04-24 DIAGNOSIS — Y998 Other external cause status: Secondary | ICD-10-CM | POA: Insufficient documentation

## 2014-04-24 DIAGNOSIS — Z79899 Other long term (current) drug therapy: Secondary | ICD-10-CM | POA: Insufficient documentation

## 2014-04-24 DIAGNOSIS — Z8719 Personal history of other diseases of the digestive system: Secondary | ICD-10-CM | POA: Insufficient documentation

## 2014-04-24 DIAGNOSIS — Y9289 Other specified places as the place of occurrence of the external cause: Secondary | ICD-10-CM | POA: Insufficient documentation

## 2014-04-24 DIAGNOSIS — Z720 Tobacco use: Secondary | ICD-10-CM | POA: Insufficient documentation

## 2014-04-24 DIAGNOSIS — X58XXXA Exposure to other specified factors, initial encounter: Secondary | ICD-10-CM | POA: Insufficient documentation

## 2014-04-24 MED ORDER — PREDNISONE 20 MG PO TABS: 40.0000 mg | ORAL_TABLET | Freq: Every day | ORAL | Status: DC

## 2014-04-24 MED ORDER — PREDNISONE 20 MG PO TABS
60.0000 mg | ORAL_TABLET | Freq: Once | ORAL | Status: AC
  Administered 2014-04-24: 60 mg via ORAL
  Filled 2014-04-24: qty 3

## 2014-04-24 MED ORDER — DIPHENHYDRAMINE HCL 25 MG PO CAPS
50.0000 mg | ORAL_CAPSULE | Freq: Once | ORAL | Status: AC
  Administered 2014-04-24: 50 mg via ORAL
  Filled 2014-04-24: qty 2

## 2014-04-24 NOTE — ED Provider Notes (Addendum)
CSN: [id number]     Arrival date & time 04/24/14  1602 History   First MD Initiated Contact with Patient 04/24/14 1638     Chief Complaint  Patient presents with  . Allergic Reaction     (Consider location/radiation/quality/duration/timing/severity/associated sxs/prior Treatment) Patient is a 24 y.o. female presenting with allergic reaction. The history is provided by the patient.  Allergic Reaction Presenting symptoms: itching and rash   Presenting symptoms: no wheezing   Severity:  Moderate Prior allergic episodes:  No prior episodes Context: new detergents/soaps   Context: no animal exposure, no chemicals, no dairy/milk products, no eggs, no grass, no insect bite/sting, no jewelry/metal, no nuts and no poison ivy   Relieved by:  None tried Worsened by:  Nothing tried Ineffective treatments:  None tried   Past Medical History  Diagnosis Date  . GERD (gastroesophageal reflux disease)    Past Surgical History  Procedure Laterality Date  . Esophageal dilation     Family History  Problem Relation Age of Onset  . Hypertension Other    History  Substance Use Topics  . Smoking status: Current Every Day Smoker -- 0.50 packs/day    Types: Cigarettes  . Smokeless tobacco: Not on file  . Alcohol Use: Yes   OB History    No data available     Review of Systems  HENT: Negative for drooling.   Respiratory: Negative for shortness of breath and wheezing.   Skin: Positive for itching and rash.  All other systems reviewed and are negative.     Allergies  Other  Home Medications   Prior to Admission medications   Medication Sig Start Date End Date Taking? Authorizing Provider  ibuprofen (ADVIL,MOTRIN) 200 MG tablet Take 400 mg by mouth every 6 (six) hours as needed for headache (headache).    Yes Historical Provider, MD  triamcinolone cream (KENALOG) 0.1 % Apply 1 application topically 2 (two) times daily. 01/31/14  Yes Lonia SkinnerLeslie K Sofia, PA-C  hydrocortisone (ANUSOL-HC)  2.5 % rectal cream Apply rectally 2 times daily Patient not taking: Reported on 04/24/2014 06/21/13   Toy CookeyMegan Docherty, MD  hydrOXYzine (ATARAX/VISTARIL) 25 MG tablet Take 1 tablet (25 mg total) by mouth every 6 (six) hours. Patient not taking: Reported on 04/24/2014 01/31/14   Elson AreasLeslie K Sofia, PA-C  ondansetron (ZOFRAN) 4 MG tablet Take 1 tablet (4 mg total) by mouth every 6 (six) hours. Patient not taking: Reported on 04/24/2014 06/21/13   Toy CookeyMegan Docherty, MD   BP 138/79 mmHg  Pulse 105  Temp(Src) 98.3 F (36.8 C) (Oral)  Resp 18  SpO2 100%  LMP 03/24/2014 (Exact Date) Physical Exam  Constitutional: She is oriented to person, place, and time. She appears well-developed and well-nourished. No distress.  HENT:  Head: Normocephalic and atraumatic.  Cardiovascular: Normal rate.   Pulmonary/Chest: Effort normal.  Neurological: She is alert and oriented to person, place, and time.  Skin: Skin is warm and dry. Rash noted. Rash is urticarial.  4-5 large 4cm in diameter raised lesions with induration and excoriation.  No drainage or fluctuance  Nursing note and vitals reviewed.   ED Course  Procedures (including critical care time) Labs Review Labs Reviewed - No data to display  Imaging Review No results found.   EKG Interpretation None      MDM   Final diagnoses:  Allergic reaction, initial encounter    Patient here with evidence of a reaction to something. Unclear what the reaction is from. Lower suspicion for a bite  as patient has a significant other with her who is having no symptoms. She does not know if she's been in contact with anything new. She uses She is in creams all the time without difficulty or problems. She denies any reaction to food such as throat swelling, tingling of her tongue or shortness of breath. She has not taken any medication for this which is now been going on for 3 days. Will treat patient with prednisone and benadryl.  Given number for  allergy    Gwyneth Sprout, MD 04/24/14 1656  Gwyneth Sprout, MD 04/24/14 [phone]

## 2014-04-24 NOTE — Discharge Instructions (Signed)
Allergies  Allergies may happen from anything your body is sensitive to. This may be food, medicines, pollens, chemicals, and many other things. Food allergies can be severe and deadly.  HOME CARE  If you do not know what causes a reaction, keep a diary. Write down the foods you ate and the symptoms that followed. Avoid foods that cause reactions.  If you have red raised spots (hives) or a rash:  Take medicine as told by your doctor.  Use medicines for red raised spots and itching as needed.  Apply cold cloths (compresses) to the skin. Take a cool bath. Avoid hot baths or showers.  If you are severely allergic:  It is often necessary to go to the hospital after you have treated your reaction.  Wear your medical alert jewelry.  You and your family must learn how to give a allergy shot or use an allergy kit (anaphylaxis kit).  Always carry your allergy kit or shot with you. Use this medicine as told by your doctor if a severe reaction is occurring. GET HELP RIGHT AWAY IF:  You have trouble breathing or are making high-pitched whistling sounds (wheezing).  You have a tight feeling in your chest or throat.  You have a puffy (swollen) mouth.  You have red raised spots, puffiness (swelling), or itching all over your body.  You have had a severe reaction that was helped by your allergy kit or shot. The reaction can return once the medicine has worn off.  You think you are having a food allergy. Symptoms most often happen within 30 minutes of eating a food.  Your symptoms have not gone away within 2 days or are getting worse.  You have new symptoms.  You want to retest yourself with a food or drink you think causes an allergic reaction. Only do this under the care of a doctor. MAKE SURE YOU:   Understand these instructions.  Will watch your condition.  Will get help right away if you are not doing well or get worse. Document Released: 06/12/2012 Document Reviewed:  06/12/2012 [facility] Patient Information 2015 [place]. This information is not intended to replace advice given to you by your health care provider. Make sure you discuss any questions you have with your health care provider.  Drug Allergy Allergic reactions to medicines are common. Some allergic reactions are mild. A delayed type of drug allergy that occurs 1 week or more after exposure to a medicine or vaccine is called serum sickness. A life-threatening, sudden (acute) allergic reaction that involves the whole body is called anaphylaxis. CAUSES  "True" drug allergies occur when there is an allergic reaction to a medicine. This is caused by overactivity of the immune system. First, the body becomes sensitized. The immune system is triggered by your first exposure to the medicine. Following this first exposure, future exposure to the same medicine may be life-threatening. Almost any medicine can cause an allergic reaction. Common ones are:  Penicillin.  Sulfonamides (sulfa drugs).  Local anesthetics.  X-ray dyes that contain iodine. SYMPTOMS  Common symptoms of a minor allergic reaction are:  Swelling around the mouth.  An itchy red rash or hives.  Vomiting or diarrhea. Anaphylaxis can cause swelling of the mouth and throat. This makes it difficult to breathe and swallow. Severe reactions can be fatal within seconds, even after exposure to only a trace amount of the drug that causes the reaction. HOME CARE INSTRUCTIONS   If you are unsure of what caused your  reaction, keep a diary of foods and medicines used. Include the symptoms that followed. Avoid anything that causes reactions.  You may want to follow up with an allergy specialist after the reaction has cleared in order to be tested to confirm the allergy. It is important to confirm that your reaction is an allergy, not just a side effect to the medicine. If you have a true allergy to a medicine, this may prevent that  medicine and related medicines from being given to you when you are very ill.  If you have hives or a rash:  Take medicines as directed by your caregiver.  You may use an over-the-counter antihistamine (diphenhydramine) as needed.  Apply cold compresses to the skin or take baths in cool water. Avoid hot baths or showers.  If you are severely allergic:  Continuous observation after a severe reaction may be needed. Hospitalization is often required.  Wear a medical alert bracelet or necklace stating your allergy.  You and your family must learn how to use an anaphylaxis kit or give an epinephrine injection to temporarily treat an emergency allergic reaction. If you have had a severe reaction, always carry your epinephrine injection or anaphylaxis kit with you. This can be lifesaving if you have a severe reaction.  Do not drive or perform tasks after treatment until the medicines used to treat your reaction have worn off, or until your caregiver says it is okay. SEEK MEDICAL CARE IF:   You think you had an allergic reaction. Symptoms usually start within 30 minutes after exposure.  Symptoms are getting worse rather than better.  You develop new symptoms.  The symptoms that brought you to your caregiver return. SEEK IMMEDIATE MEDICAL CARE IF:   You have swelling of the mouth, difficulty breathing, or wheezing.  You have a tight feeling in your chest or throat.  You develop hives, swelling, or itching all over your body.  You develop severe vomiting or diarrhea.  You feel faint or pass out. This is an emergency. Use your epinephrine injection or anaphylaxis kit as you have been instructed. Call for emergency medical help. Even if you improve after the injection, you need to be examined at a hospital emergency department. MAKE SURE YOU:   Understand these instructions.  Will watch your condition.  Will get help right away if you are not doing well or get worse. Document  Released: 02/15/2005 Document Revised: 05/10/2011 Document Reviewed: 07/22/2010 [facility] Patient Information 2015 [place], [place]. This information is not intended to replace advice given to you by your health care provider. Make sure you discuss any questions you have with your health care provider.

## 2014-04-24 NOTE — ED Notes (Signed)
Pt state she has rash to breast, stomach and elbows. Recently tried a new lotion from [employer]. Pt just recently got a tattoo about 2 week ago. Pt c/o itching and feels as if she cannot catch her breathe. Pt NAD.

## 2015-03-23 ENCOUNTER — Emergency Department (HOSPITAL_COMMUNITY)
Admission: EM | Admit: 2015-03-23 | Discharge: 2015-03-23 | Disposition: A | Discharge status: Home / Self Care | Payer: No Typology Code available for payment source | Attending: Emergency Medicine | Admitting: Emergency Medicine

## 2015-03-23 ENCOUNTER — Encounter (HOSPITAL_COMMUNITY): Payer: Self-pay | Admitting: *Deleted

## 2015-03-23 DIAGNOSIS — R000 Tachycardia, unspecified: Secondary | ICD-10-CM | POA: Insufficient documentation

## 2015-03-23 DIAGNOSIS — Z3202 Encounter for pregnancy test, result negative: Secondary | ICD-10-CM | POA: Insufficient documentation

## 2015-03-23 DIAGNOSIS — F14120 Cocaine abuse with intoxication, uncomplicated: Secondary | ICD-10-CM | POA: Insufficient documentation

## 2015-03-23 DIAGNOSIS — R410 Disorientation, unspecified: Secondary | ICD-10-CM | POA: Insufficient documentation

## 2015-03-23 DIAGNOSIS — F17210 Nicotine dependence, cigarettes, uncomplicated: Secondary | ICD-10-CM | POA: Insufficient documentation

## 2015-03-23 DIAGNOSIS — Z8719 Personal history of other diseases of the digestive system: Secondary | ICD-10-CM | POA: Insufficient documentation

## 2015-03-23 DIAGNOSIS — F14920 Cocaine use, unspecified with intoxication, uncomplicated: Secondary | ICD-10-CM

## 2015-03-23 LAB — CBC WITH DIFFERENTIAL/PLATELET
BASOS ABS: 0 10*3/uL (ref 0.0–0.1)
BASOS PCT: 0 %
EOS ABS: 0 10*3/uL (ref 0.0–0.7)
EOS PCT: 1 %
HCT: 29.9 % — ABNORMAL LOW (ref 36.0–46.0)
HEMOGLOBIN: 9.4 g/dL — AB (ref 12.0–15.0)
LYMPHS ABS: 2.9 10*3/uL (ref 0.7–4.0)
LYMPHS PCT: 39 %
MCH: 24.7 pg — ABNORMAL LOW (ref 26.0–34.0)
MCHC: 31.4 g/dL (ref 30.0–36.0)
MCV: 78.5 fL (ref 78.0–100.0)
MONO ABS: 0.6 10*3/uL (ref 0.1–1.0)
MONOS PCT: 7 %
NEUTROS ABS: 4 10*3/uL (ref 1.7–7.7)
Neutrophils Relative %: 53 %
PLATELETS: 350 10*3/uL (ref 150–400)
RBC: 3.81 MIL/uL — AB (ref 3.87–5.11)
RDW: 16.3 % — ABNORMAL HIGH (ref 11.5–15.5)
WBC: 7.5 10*3/uL (ref 4.0–10.5)

## 2015-03-23 LAB — COMPREHENSIVE METABOLIC PANEL
ALT: 14 U/L (ref 14–54)
AST: 20 U/L (ref 15–41)
Albumin: 4.7 g/dL (ref 3.5–5.0)
Alkaline Phosphatase: 85 U/L (ref 38–126)
Anion gap: 10 (ref 5–15)
BILIRUBIN TOTAL: 0.5 mg/dL (ref 0.3–1.2)
BUN: 8 mg/dL (ref 6–20)
CO2: 25 mmol/L (ref 22–32)
Calcium: 9.6 mg/dL (ref 8.9–10.3)
Chloride: 108 mmol/L (ref 101–111)
Creatinine, Ser: 0.79 mg/dL (ref 0.44–1.00)
GFR calc Af Amer: >60 mL/min (ref >60)
Glucose, Bld: 83 mg/dL (ref 65–99)
Potassium: 3.3 mmol/L — ABNORMAL LOW (ref 3.5–5.1)
Sodium: 143 mmol/L (ref 135–145)
TOTAL PROTEIN: 8.1 g/dL (ref 6.5–8.1)

## 2015-03-23 LAB — RAPID URINE DRUG SCREEN, HOSP PERFORMED
AMPHETAMINES: NOT DETECTED
BENZODIAZEPINES: NOT DETECTED
Barbiturates: NOT DETECTED
Cocaine: POSITIVE — AB
OPIATES: NOT DETECTED
Tetrahydrocannabinol: NOT DETECTED

## 2015-03-23 LAB — PREGNANCY, URINE: PREG TEST UR: NEGATIVE

## 2015-03-23 LAB — ETHANOL: Alcohol, Ethyl (B): <5 mg/dL (ref <5)

## 2015-03-23 MED ORDER — SODIUM CHLORIDE 0.9 % IV BOLUS (SEPSIS)
1000.0000 mL | Freq: Once | INTRAVENOUS | Status: AC
  Administered 2015-03-23: 1000 mL via INTRAVENOUS
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
## 2015-03-23 NOTE — ED Notes (Signed)
MD at bedside. 
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
## 2015-03-23 NOTE — Discharge Instructions (Signed)
Stimulant Use Disorder-Cocaine  Cocaine is one of a group of powerful drugs called stimulants. Cocaine has medical uses for stopping nosebleeds and for pain control before minor nose or dental surgery. However, cocaine is misused because of the effects that it produces. These effects include:   · A feeling of extreme pleasure.  · Alertness.  · High energy.  Common street names for cocaine include coke, crack, blow, snow, and nose candy. Cocaine is snorted, dissolved in water and injected, or smoked.   Stimulants are addictive because they activate regions of the brain that produce both the pleasurable sensation of "reward" and psychological dependence. Together, these actions account for loss of control and the rapid development of drug dependence. This means you become ill without the drug (withdrawal) and need to keep using it to function.   Stimulant use disorder is use of stimulants that disrupts your daily life. It disrupts relationships with family and friends and how you do your job. Cocaine increases your blood pressure and heart rate. It can cause a heart attack or stroke. Cocaine can also cause death from irregular heart rate or seizures.  SYMPTOMS  Symptoms of stimulant use disorder with cocaine include:  · Use of cocaine in larger amounts or over a longer period of time than intended.  · Unsuccessful attempts to cut down or control cocaine use.  · A lot of time spent obtaining, using, or recovering from the effects of cocaine.  · A strong desire or urge to use cocaine (craving).  · Continued use of cocaine in spite of major problems at work, school, or home because of use.  · Continued use of cocaine in spite of relationship problems because of use.  · Giving up or cutting down on important life activities because of cocaine use.  · Use of cocaine over and over in situations when it is physically hazardous, such as driving a car.  · Continued use of cocaine in spite of a physical problem that is likely  related to use. Physical problems can include:  ¨ Malnutrition.  ¨ Nosebleeds.  ¨ Chest pain.  ¨ High blood pressure.  ¨ A hole that develops between the part of your nose that separates your nostrils (perforated nasal septum).  ¨ Lung and kidney damage.  · Continued use of cocaine in spite of a mental problem that is likely related to use. Mental problems can include:  ¨ Schizophrenia-like symptoms.  ¨ Depression.  ¨ Bipolar mood swings.  ¨ Anxiety.  ¨ Sleep problems.  · Need to use more and more cocaine to get the same effect, or lessened effect over time with use of the same amount of cocaine (tolerance).  · Having withdrawal symptoms when cocaine use is stopped, or using cocaine to reduce or avoid withdrawal symptoms. Withdrawal symptoms include:  ¨ Depressed or irritable mood.  ¨ Low energy or restlessness.  ¨ Bad dreams.  ¨ Poor or excessive sleep.  ¨ Increased appetite.  DIAGNOSIS  Stimulant use disorder is diagnosed by your health care provider. You may be asked questions about your cocaine use and how it affects your life. A physical exam may be done. A drug screen may be ordered. You may be referred to a mental health professional. The diagnosis of stimulant use disorder requires at least two symptoms within 12 months. The type of stimulant use disorder depends on the number of signs and symptoms you have. The type may be:  · Mild. Two or three signs and symptoms.  ·   Moderate. Four or five signs and symptoms.  · Severe. Six or more signs and symptoms.  TREATMENT  Treatment for stimulant use disorder is usually provided by mental health professionals with training in substance use disorders. The following options are available:  · Counseling or talk therapy. Talk therapy addresses the reasons you use cocaine and ways to keep you from using again. Goals of talk therapy include:  ¨ Identifying and avoiding triggers for use.  ¨ Handling cravings.  ¨ Replacing use with healthy activities.  · Support groups.  Support groups provide emotional support, advice, and guidance.  · Medicine. Certain medicines may decrease cocaine cravings or withdrawal symptoms.  HOME CARE INSTRUCTIONS  · Take medicines only as directed by your health care provider.  · Identify the people and activities that trigger your cocaine use and avoid them.  · Keep all follow-up visits as directed by your health care provider.  SEEK MEDICAL CARE IF:  · Your symptoms get worse or you relapse.  · You are not able to take medicines as directed.  SEEK IMMEDIATE MEDICAL CARE IF:  · You have serious thoughts about hurting yourself or others.  · You have a seizure, chest pain, sudden weakness, or loss of speech or vision.  FOR MORE INFORMATION  · National Institute on Drug Abuse: www.drugabuse.gov  · Substance Abuse and Mental Health Services Administration: www.samhsa.gov     This information is not intended to replace advice given to you by your health care provider. Make sure you discuss any questions you have with your health care provider.     Document Released: 02/13/2000 Document Revised: 03/08/2014 Document Reviewed: 02/28/2013  Elsevier Interactive Patient Education ©2016 [employer] Inc[employer]

## 2015-06-19 ENCOUNTER — Encounter (HOSPITAL_COMMUNITY): Payer: Self-pay | Admitting: Emergency Medicine

## 2015-06-19 ENCOUNTER — Emergency Department (HOSPITAL_COMMUNITY)
Admission: EM | Admit: 2015-06-19 | Discharge: 2015-06-19 | Disposition: A | Discharge status: Home / Self Care | Payer: No Typology Code available for payment source | Attending: Emergency Medicine | Admitting: Emergency Medicine

## 2015-06-19 DIAGNOSIS — F17210 Nicotine dependence, cigarettes, uncomplicated: Secondary | ICD-10-CM | POA: Insufficient documentation

## 2015-06-19 DIAGNOSIS — Z8619 Personal history of other infectious and parasitic diseases: Secondary | ICD-10-CM | POA: Insufficient documentation

## 2015-06-19 DIAGNOSIS — Z3202 Encounter for pregnancy test, result negative: Secondary | ICD-10-CM | POA: Insufficient documentation

## 2015-06-19 DIAGNOSIS — B9689 Other specified bacterial agents as the cause of diseases classified elsewhere: Secondary | ICD-10-CM

## 2015-06-19 DIAGNOSIS — N760 Acute vaginitis: Secondary | ICD-10-CM | POA: Insufficient documentation

## 2015-06-19 DIAGNOSIS — R102 Pelvic and perineal pain: Secondary | ICD-10-CM

## 2015-06-19 DIAGNOSIS — Z8719 Personal history of other diseases of the digestive system: Secondary | ICD-10-CM | POA: Insufficient documentation

## 2015-06-19 DIAGNOSIS — N898 Other specified noninflammatory disorders of vagina: Secondary | ICD-10-CM

## 2015-06-19 LAB — URINALYSIS, ROUTINE W REFLEX MICROSCOPIC
Bilirubin Urine: NEGATIVE
Glucose, UA: NEGATIVE mg/dL
Hgb urine dipstick: NEGATIVE
Ketones, ur: NEGATIVE mg/dL
LEUKOCYTES UA: NEGATIVE
NITRITE: NEGATIVE
PH: 7.5 (ref 5.0–8.0)
Protein, ur: NEGATIVE mg/dL
SPECIFIC GRAVITY, URINE: 1.031 — AB (ref 1.005–1.030)

## 2015-06-19 LAB — CBC
HEMATOCRIT: 31.9 % — AB (ref 36.0–46.0)
HEMOGLOBIN: 9.7 g/dL — AB (ref 12.0–15.0)
MCH: 23 pg — ABNORMAL LOW (ref 26.0–34.0)
MCHC: 30.4 g/dL (ref 30.0–36.0)
MCV: 75.8 fL — ABNORMAL LOW (ref 78.0–100.0)
Platelets: 335 10*3/uL (ref 150–400)
RBC: 4.21 MIL/uL (ref 3.87–5.11)
RDW: 18 % — ABNORMAL HIGH (ref 11.5–15.5)
WBC: 5 10*3/uL (ref 4.0–10.5)

## 2015-06-19 LAB — COMPREHENSIVE METABOLIC PANEL
ALBUMIN: 4.4 g/dL (ref 3.5–5.0)
ALT: 14 U/L (ref 14–54)
ANION GAP: 7 (ref 5–15)
AST: 18 U/L (ref 15–41)
Alkaline Phosphatase: 68 U/L (ref 38–126)
BILIRUBIN TOTAL: 0.1 mg/dL — AB (ref 0.3–1.2)
BUN: 17 mg/dL (ref 6–20)
CALCIUM: 9.2 mg/dL (ref 8.9–10.3)
CO2: 24 mmol/L (ref 22–32)
Chloride: 111 mmol/L (ref 101–111)
Creatinine, Ser: 0.76 mg/dL (ref 0.44–1.00)
GLUCOSE: 66 mg/dL (ref 65–99)
POTASSIUM: 3.8 mmol/L (ref 3.5–5.1)
Sodium: 142 mmol/L (ref 135–145)
TOTAL PROTEIN: 7.5 g/dL (ref 6.5–8.1)

## 2015-06-19 LAB — WET PREP, GENITAL
Sperm: NONE SEEN
Trich, Wet Prep: NONE SEEN
Yeast Wet Prep HPF POC: NONE SEEN

## 2015-06-19 LAB — I-STAT BETA HCG BLOOD, ED (MC, WL, AP ONLY): I-stat hCG, quantitative: <5 m[IU]/mL (ref <5)

## 2015-06-19 LAB — LIPASE, BLOOD: Lipase: 20 U/L (ref 11–51)

## 2015-06-19 MED ORDER — CEFTRIAXONE SODIUM 1 G IJ SOLR
1.0000 g | Freq: Once | INTRAMUSCULAR | Status: AC
  Administered 2015-06-19: 1 g via INTRAMUSCULAR
  Filled 2015-06-19: qty 10

## 2015-06-19 MED ORDER — METRONIDAZOLE 500 MG PO TABS: 500.0000 mg | ORAL_TABLET | Freq: Two times a day (BID) | ORAL | Status: AC

## 2015-06-19 MED ORDER — LIDOCAINE HCL (PF) 1 % IJ SOLN
INTRAMUSCULAR | Status: AC
  Administered 2015-06-19: 23:00:00
  Filled 2015-06-19: qty 5

## 2015-06-19 MED ORDER — DOXYCYCLINE HYCLATE 100 MG PO CAPS: 100.0000 mg | ORAL_CAPSULE | Freq: Two times a day (BID) | ORAL | Status: AC

## 2015-06-19 NOTE — ED Notes (Signed)
Provider in room  

## 2015-06-19 NOTE — ED Notes (Signed)
Pain with urination, vaginal discharge/itching x 2 months per patient. Does not appear to be in obvious distress in triage, vitals stable. "I think I need an STD test and I might have BV."

## 2015-06-19 NOTE — Discharge Instructions (Signed)
Medications: Doxycycline, metronidazole  Treatment: Please take doxycycline and metronidazole as prescribed for 2 weeks. Do not drink alcohol while taking metronidazole as it can make you feel very ill. Try to keep your skin covered while you were in the sun while taking doxycycline, as it can cause sensitive skin or rash when exposed to UV light. If any of your lab results return POSITIVE, you will be called in the next 2-3 days.  Follow-up: Please follow-up with a primary care provider or the Department of Health if your symptoms or unresolving or worsening. Please follow-up with a primary care provider for further evaluation of your anemia. Please return to emergency department if you develop any new or worsening symptoms.   Bacterial Vaginosis Bacterial vaginosis is a vaginal infection that occurs when the normal balance of bacteria in the vagina is disrupted. It results from an overgrowth of certain bacteria. This is the most common vaginal infection in women of childbearing age. Treatment is important to prevent complications, especially in pregnant women, as it can cause a premature delivery. CAUSES  Bacterial vaginosis is caused by an increase in harmful bacteria that are normally present in smaller amounts in the vagina. Several different kinds of bacteria can cause bacterial vaginosis. However, the reason that the condition develops is not fully understood. RISK FACTORS Certain activities or behaviors can put you at an increased risk of developing bacterial vaginosis, including:  Having a new sex partner or multiple sex partners.  Douching.  Using an intrauterine device (IUD) for contraception. Women do not get bacterial vaginosis from toilet seats, bedding, swimming pools, or contact with objects around them. SIGNS AND SYMPTOMS  Some women with bacterial vaginosis have no signs or symptoms. Common symptoms include:  Grey vaginal discharge.  A fishlike odor with discharge,  especially after sexual intercourse.  Itching or burning of the vagina and vulva.  Burning or pain with urination. DIAGNOSIS  Your health care provider will take a medical history and examine the vagina for signs of bacterial vaginosis. A sample of vaginal fluid may be taken. Your health care provider will look at this sample under a microscope to check for bacteria and abnormal cells. A vaginal pH test may also be done.  TREATMENT  Bacterial vaginosis may be treated with antibiotic medicines. These may be given in the form of a pill or a vaginal cream. A second round of antibiotics may be prescribed if the condition comes back after treatment. Because bacterial vaginosis increases your risk for sexually transmitted diseases, getting treated can help reduce your risk for chlamydia, gonorrhea, HIV, and herpes. HOME CARE INSTRUCTIONS   Only take over-the-counter or prescription medicines as directed by your health care provider.  If antibiotic medicine was prescribed, take it as directed. Make sure you finish it even if you start to feel better.  Tell all sexual partners that you have a vaginal infection. They should see their health care provider and be treated if they have problems, such as a mild rash or itching.  During treatment, it is important that you follow these instructions:  Avoid sexual activity or use condoms correctly.  Do not douche.  Avoid alcohol as directed by your health care provider.  Avoid breastfeeding as directed by your health care provider. SEEK MEDICAL CARE IF:   Your symptoms are not improving after 3 days of treatment.  You have increased discharge or pain.  You have a fever. MAKE SURE YOU:   Understand these instructions.  Will watch your  condition.  Will get help right away if you are not doing well or get worse. FOR MORE INFORMATION  Centers for Disease Control and Prevention, Division of STD Prevention: [employer] American Sexual Health  Association (ASHA): www.ashastd.org    This information is not intended to replace advice given to you by your health care provider. Make sure you discuss any questions you have with your health care provider.   Document Released: 02/15/2005 Document Revised: 03/08/2014 Document Reviewed: 09/27/2012 Elsevier Interactive Patient Education [employer]

## 2015-06-19 NOTE — ED Notes (Signed)
Pelvic supplies at bedside. 

## 2015-06-19 NOTE — ED Provider Notes (Signed)
CSN: [id number]     Arrival date & time 06/19/15  1550 History   First MD Initiated Contact with Patient 06/19/15 2002     Chief Complaint  Patient presents with  . Vaginal Discharge  . Abdominal Pain     (Consider location/radiation/quality/duration/timing/severity/associated sxs/prior Treatment) HPI Comments: Patient is a 25 year old female with past history of GERD who presents today with vaginal discharge, itching, and mild lower abdominal pain. Patient reports that she has been having a white discharge for the past 2 months. Patient states, "feels like my vagina is irritated." Patient began with bilateral lower abdominal pain that she describes as intermittent and sharp, that is worse when she urinates or moves her bowels. Patient is sexually active and does not use any birth control. Patient's last initial period was 2 weeks ago and was normal for her. Patient has history of trichomonas 6 years ago. Patient endorses occasional vomiting from reflux. She denies chest pain, shortness of breath, burning on urination.  Patient is a 25 y.o. female presenting with vaginal discharge and abdominal pain. The history is provided by the patient.  Vaginal Discharge Associated symptoms: abdominal pain   Associated symptoms: no fever, no nausea and no vomiting   Abdominal Pain Associated symptoms: vaginal discharge   Associated symptoms: no chest pain, no chills, no fever, no nausea, no shortness of breath, no sore throat, no vaginal bleeding and no vomiting     Past Medical History  Diagnosis Date  . GERD (gastroesophageal reflux disease)    Past Surgical History  Procedure Laterality Date  . Esophageal dilation     Family History  Problem Relation Age of Onset  . Hypertension Other    Social History  Substance Use Topics  . Smoking status: Current Every Day Smoker -- 0.50 packs/day    Types: Cigarettes  . Smokeless tobacco: None  . Alcohol Use: Yes   OB History    No data available      Review of Systems  Constitutional: Negative for fever and chills.  HENT: Negative for facial swelling and sore throat.   Respiratory: Negative for shortness of breath.   Cardiovascular: Negative for chest pain.  Gastrointestinal: Positive for abdominal pain. Negative for nausea and vomiting.  Genitourinary: Positive for vaginal discharge, vaginal pain and pelvic pain. Negative for vaginal bleeding.  Musculoskeletal: Negative for back pain.  Skin: Negative for rash and wound.  Neurological: Negative for headaches.  Psychiatric/Behavioral: The patient is not nervous/anxious.       Allergies  Other  Home Medications   Prior to Admission medications   Medication Sig Start Date End Date Taking? Authorizing Provider  doxycycline (VIBRAMYCIN) 100 MG capsule Take 1 capsule (100 mg total) by mouth 2 (two) times daily. 06/19/15 07/03/15  Emi Holes, PA-C  hydrocortisone (ANUSOL-HC) 2.5 % rectal cream Apply rectally 2 times daily Patient not taking: Reported on 04/24/2014 06/21/13   Toy Cookey, MD  hydrOXYzine (ATARAX/VISTARIL) 25 MG tablet Take 1 tablet (25 mg total) by mouth every 6 (six) hours. Patient not taking: Reported on 04/24/2014 01/31/14   Elson Areas, PA-C  metroNIDAZOLE (FLAGYL) 500 MG tablet Take 1 tablet (500 mg total) by mouth 2 (two) times daily. 06/19/15 07/03/15  Ara Mano M Kenzy Campoverde, PA-C  ondansetron (ZOFRAN) 4 MG tablet Take 1 tablet (4 mg total) by mouth every 6 (six) hours. Patient not taking: Reported on 04/24/2014 06/21/13   Toy Cookey, MD  predniSONE (DELTASONE) 20 MG tablet Take 2 tablets (40 mg total) by  mouth daily. Patient not taking: Reported on 03/23/2015 04/24/14   Gwyneth Sprout, MD  triamcinolone cream (KENALOG) 0.1 % Apply 1 application topically 2 (two) times daily. Patient not taking: Reported on 03/23/2015 01/31/14   Elson Areas, PA-C   BP 136/92 mmHg  Pulse 72  Temp(Src) 98.8 F (37.1 C) (Oral)  Resp 18  SpO2 100% Physical Exam    Constitutional: She appears well-developed and well-nourished. No distress.  HENT:  Head: Normocephalic and atraumatic.  Mouth/Throat: Oropharynx is clear and moist. No oropharyngeal exudate.  Eyes: Conjunctivae are normal. Pupils are equal, round, and reactive to light. Right eye exhibits no discharge. Left eye exhibits no discharge. No scleral icterus.  Neck: Normal range of motion. Neck supple. No thyromegaly present.  Cardiovascular: Normal rate, regular rhythm, normal heart sounds and intact distal pulses.  Exam reveals no gallop and no friction rub.   No murmur heard. Pulmonary/Chest: Effort normal and breath sounds normal. No stridor. No respiratory distress. She has no wheezes. She has no rales.  Abdominal: Soft. Bowel sounds are normal. She exhibits no distension. There is tenderness in the right lower quadrant and suprapubic area. There is no rebound, no guarding, no CVA tenderness and negative Murphy's sign.    Mild tenderness on palpation  Genitourinary: No labial fusion. There is no rash, tenderness, lesion or injury on the right labia. There is no rash, tenderness, lesion or injury on the left labia. Cervix exhibits discharge. Cervix exhibits no motion tenderness and no friability. Right adnexum displays tenderness. Right adnexum displays no mass and no fullness. Left adnexum displays tenderness. Left adnexum displays no mass and no fullness. No erythema, tenderness or bleeding in the vagina. No foreign body around the vagina. No signs of injury around the vagina. Vaginal discharge found.  Musculoskeletal: She exhibits no edema.  Lymphadenopathy:    She has no cervical adenopathy.  Neurological: She is alert. Coordination normal.  Skin: Skin is warm and dry. No rash noted. She is not diaphoretic. No pallor.  Psychiatric: She has a normal mood and affect.  Nursing note and vitals reviewed.   ED Course  Procedures (including critical care time) Labs Review Labs Reviewed  WET  PREP, GENITAL - Abnormal; Notable for the following:    Clue Cells Wet Prep HPF POC PRESENT (*)    WBC, Wet Prep HPF POC FEW (*)    All other components within normal limits  COMPREHENSIVE METABOLIC PANEL - Abnormal; Notable for the following:    Total Bilirubin 0.1 (*)    All other components within normal limits  CBC - Abnormal; Notable for the following:    Hemoglobin 9.7 (*)    HCT 31.9 (*)    MCV 75.8 (*)    MCH 23.0 (*)    RDW 18.0 (*)    All other components within normal limits  URINALYSIS, ROUTINE W REFLEX MICROSCOPIC (NOT AT [facility]) - Abnormal; Notable for the following:    APPearance HAZY (*)    Specific Gravity, Urine 1.031 (*)    All other components within normal limits  URINE CULTURE  LIPASE, BLOOD  RPR  HIV ANTIBODY (ROUTINE TESTING)  POC URINE PREG, ED  I-STAT BETA HCG BLOOD, ED ([facility], [facility], [facility] ONLY)  GC/CHLAMYDIA PROBE AMP ([place]) NOT AT [facility]    Imaging Review No results found. I have personally reviewed and evaluated these images and lab results as part of my medical decision-making.   EKG Interpretation None      MDM  Patient treated in the ED for STI with Ceftriaxone. Patient advised to inform and treat all sexual partners.  Pt advised on safe sex practices and understands that they have GC/Chlamydia cultures pending and will result in 2-3 days. HIV and RPR sent. Wet prep shows clue cells and few white blood cells. Mild concern for PID due to adnexal tenderness; no CMT. Discussed with patient and she was concerned and wanted to be treated aggressively with antibiotics to cover for PID. Patient discharged with doxycycline and metronidazole. Vital stable. Pt encouraged to follow up at local health department for future STI checks. Patient unaware that she was anemic, and advised to follow up and establish care with a primary care provider. Hemoglobin 9.7 today, increased from last visit in January. Patient states she does experience lightheadedness  occasionally and has very heavy menstrual bleeding. Discussed strict return precautions. Pt appears safe for discharge. Patient discussed with Dr. Rubin PayorPickering who is in agreement with plan.   Final diagnoses:  Vaginal discharge  Pelvic pain in female  Bacterial vaginosis       Emi Holeslexandra M Kaarin Pardy, PA-C 06/19/15 2233  Benjiman CoreNathan Pickering, MD 06/21/15 [phone]

## 2015-06-20 LAB — GC/CHLAMYDIA PROBE AMP (~~LOC~~) NOT AT ARMC
Chlamydia: NEGATIVE
Neisseria Gonorrhea: NEGATIVE

## 2015-06-20 LAB — RPR: RPR Ser Ql: NONREACTIVE

## 2015-06-20 LAB — HIV ANTIBODY (ROUTINE TESTING W REFLEX): HIV Screen 4th Generation wRfx: NONREACTIVE

## 2015-06-21 LAB — URINE CULTURE: Culture: 4000 — AB

## 2015-07-20 ENCOUNTER — Encounter (HOSPITAL_COMMUNITY): Payer: Self-pay

## 2015-07-20 ENCOUNTER — Emergency Department (HOSPITAL_COMMUNITY)
Admission: EM | Admit: 2015-07-20 | Discharge: 2015-07-20 | Disposition: A | Discharge status: Home / Self Care | Payer: No Typology Code available for payment source | Attending: Emergency Medicine | Admitting: Emergency Medicine

## 2015-07-20 DIAGNOSIS — Z792 Long term (current) use of antibiotics: Secondary | ICD-10-CM | POA: Insufficient documentation

## 2015-07-20 DIAGNOSIS — B3731 Acute candidiasis of vulva and vagina: Secondary | ICD-10-CM

## 2015-07-20 DIAGNOSIS — F17210 Nicotine dependence, cigarettes, uncomplicated: Secondary | ICD-10-CM | POA: Insufficient documentation

## 2015-07-20 DIAGNOSIS — B373 Candidiasis of vulva and vagina: Secondary | ICD-10-CM | POA: Insufficient documentation

## 2015-07-20 DIAGNOSIS — Z79899 Other long term (current) drug therapy: Secondary | ICD-10-CM | POA: Insufficient documentation

## 2015-07-20 DIAGNOSIS — K219 Gastro-esophageal reflux disease without esophagitis: Secondary | ICD-10-CM | POA: Insufficient documentation

## 2015-07-20 DIAGNOSIS — Z7952 Long term (current) use of systemic steroids: Secondary | ICD-10-CM | POA: Insufficient documentation

## 2015-07-20 HISTORY — DX: Other specified bacterial agents as the cause of diseases classified elsewhere: B96.89

## 2015-07-20 HISTORY — DX: Acute vaginitis: N76.0

## 2015-07-20 LAB — URINALYSIS, ROUTINE W REFLEX MICROSCOPIC
Bilirubin Urine: NEGATIVE
GLUCOSE, UA: NEGATIVE mg/dL
KETONES UR: NEGATIVE mg/dL
Nitrite: NEGATIVE
PH: 5.5 (ref 5.0–8.0)
Protein, ur: NEGATIVE mg/dL
Specific Gravity, Urine: 1.022 (ref 1.005–1.030)

## 2015-07-20 LAB — CBC
HEMATOCRIT: 31.4 % — AB (ref 36.0–46.0)
HEMOGLOBIN: 9.9 g/dL — AB (ref 12.0–15.0)
MCH: 24 pg — AB (ref 26.0–34.0)
MCHC: 31.5 g/dL (ref 30.0–36.0)
MCV: 76 fL — AB (ref 78.0–100.0)
Platelets: 368 10*3/uL (ref 150–400)
RBC: 4.13 MIL/uL (ref 3.87–5.11)
RDW: 19.1 % — ABNORMAL HIGH (ref 11.5–15.5)
WBC: 4.9 10*3/uL (ref 4.0–10.5)

## 2015-07-20 LAB — WET PREP, GENITAL
Clue Cells Wet Prep HPF POC: NONE SEEN
Sperm: NONE SEEN
Trich, Wet Prep: NONE SEEN

## 2015-07-20 LAB — URINE MICROSCOPIC-ADD ON

## 2015-07-20 LAB — PREGNANCY, URINE: Preg Test, Ur: NEGATIVE

## 2015-07-20 MED ORDER — FLUCONAZOLE 150 MG PO TABS
150.0000 mg | ORAL_TABLET | Freq: Once | ORAL | Status: AC
  Administered 2015-07-20: 150 mg via ORAL
  Filled 2015-07-20: qty 1

## 2015-07-20 NOTE — ED Notes (Signed)
Pt was here for bv on 4/20.  Pt given antibiotics.  Finished antibiotics.  Still have vaginal irritation, discharge, swelling and itching.

## 2015-07-20 NOTE — Discharge Instructions (Signed)

## 2015-07-20 NOTE — ED Notes (Signed)
Patient was alert, oriented and stable upon discharge. RN went over AVS and patient had no further questions.  

## 2015-07-20 NOTE — ED Provider Notes (Signed)
CSN: [id number]     Arrival date & time 07/20/15  1008 History   First MD Initiated Contact with Patient 07/20/15 1208     Chief Complaint  Patient presents with  . Vaginal Itching      Patient is a 25 y.o. female presenting with vaginal itching. The history is provided by the patient.  Vaginal Itching Associated symptoms include abdominal pain and headaches. Pertinent negatives include no chest pain.  Patient presents with vaginal itching and some discharge. She was seen in the ER month ago and states she diagnosed with bacterial vaginosis. States she was given the medicine and never really got better then began to feel worse. States she's had some discharge. States it also broke her out down in that area. Some slight dysuria. Also states that she has slight headaches that time and some trouble hearing out of her left ear. States she feels lightheaded sometimes also. Slight lower abdominal pain also. Denies unprotected sex with a meal. States she has her girlfriend in the room with her.  Past Medical History  Diagnosis Date  . GERD (gastroesophageal reflux disease)   . Bacterial vaginosis    Past Surgical History  Procedure Laterality Date  . Esophageal dilation     Family History  Problem Relation Age of Onset  . Hypertension Other    Social History  Substance Use Topics  . Smoking status: Current Every Day Smoker -- 0.50 packs/day    Types: Cigarettes  . Smokeless tobacco: None  . Alcohol Use: Yes   OB History    No data available     Review of Systems  Constitutional: Negative for appetite change.  HENT: Negative for ear discharge and trouble swallowing.   Eyes: Negative for visual disturbance.  Cardiovascular: Negative for chest pain.  Gastrointestinal: Positive for abdominal pain. Negative for nausea.  Genitourinary: Positive for dysuria, vaginal discharge and vaginal pain. Negative for flank pain and vaginal bleeding.  Musculoskeletal: Negative for back pain.   Skin: Negative for wound.  Neurological: Positive for light-headedness and headaches.  Hematological: Negative for adenopathy.      Allergies  Other  Home Medications   Prior to Admission medications   Medication Sig Start Date End Date Taking? Authorizing Provider  doxycycline (VIBRAMYCIN) 100 MG capsule Take 100 mg by mouth 2 (two) times daily.   Yes Historical Provider, MD  metroNIDAZOLE (FLAGYL) 500 MG tablet Take 500 mg by mouth 2 (two) times daily.   Yes Historical Provider, MD  hydrocortisone (ANUSOL-HC) 2.5 % rectal cream Apply rectally 2 times daily Patient not taking: Reported on 04/24/2014 06/21/13   Toy Cookey, MD  hydrOXYzine (ATARAX/VISTARIL) 25 MG tablet Take 1 tablet (25 mg total) by mouth every 6 (six) hours. Patient not taking: Reported on 04/24/2014 01/31/14   Elson Areas, PA-C  ondansetron (ZOFRAN) 4 MG tablet Take 1 tablet (4 mg total) by mouth every 6 (six) hours. Patient not taking: Reported on 04/24/2014 06/21/13   Toy Cookey, MD  predniSONE (DELTASONE) 20 MG tablet Take 2 tablets (40 mg total) by mouth daily. Patient not taking: Reported on 03/23/2015 04/24/14   Gwyneth Sprout, MD  triamcinolone cream (KENALOG) 0.1 % Apply 1 application topically 2 (two) times daily. Patient not taking: Reported on 03/23/2015 01/31/14   Elson Areas, PA-C   BP 101/60 mmHg  Pulse 116  Temp(Src) 98.2 F (36.8 C) (Oral)  Resp 18  SpO2 99%  LMP 07/13/2015 Physical Exam  Constitutional: She appears well-developed.  HENT:  Head: Atraumatic.  Cardiovascular: Normal rate.   Abdominal: Soft.  Mild lower abdominal tenderness without rebound or guarding.  Musculoskeletal: She exhibits no tenderness.  Neurological: She is alert.  Skin: Skin is warm.  Pelvic exam showed thick cottage cheesy vaginal discharge. No cervical motion tenderness. No masses.  ED Course  Procedures (including critical care time) Labs Review Labs Reviewed  WET PREP, GENITAL - Abnormal;  Notable for the following:    Yeast Wet Prep HPF POC PRESENT (*)    WBC, Wet Prep HPF POC TOO NUMEROUS TO COUNT (*)    All other components within normal limits  URINALYSIS, ROUTINE W REFLEX MICROSCOPIC (NOT [facility]) - Abnormal; Notable for the following:    APPearance CLOUDY (*)    Hgb urine dipstick TRACE (*)    Leukocytes, UA LARGE (*)    All other components within normal limits  CBC - Abnormal; Notable for the following:    Hemoglobin 9.9 (*)    HCT 31.4 (*)    MCV 76.0 (*)    MCH 24.0 (*)    RDW 19.1 (*)    All other components within normal limits  URINE MICROSCOPIC-ADD ON - Abnormal; Notable for the following:    Squamous Epithelial / LPF TOO NUMEROUS TO COUNT (*)    Bacteria, UA RARE (*)    All other components within normal limits  PREGNANCY, URINE  RPR  HIV ANTIBODY (ROUTINE TESTING)  GC/CHLAMYDIA PROBE AMP ([place]) NOT AT [facility]    Imaging Review No results found. I have personally reviewed and evaluated these images and lab results as part of my medical decision-making.   EKG Interpretation None      MDM   Final diagnoses:  Candidiasis of vagina    Patient with apparent vaginal yeast infection. Recent treatment for STD and negative cultures at that time. Will discharge home.    Benjiman CoreNathan Evaline Waltman, MD 07/21/15 1254

## 2015-07-21 LAB — GC/CHLAMYDIA PROBE AMP (~~LOC~~) NOT AT ARMC
Chlamydia: NEGATIVE
NEISSERIA GONORRHEA: NEGATIVE

## 2015-07-21 LAB — RPR: RPR: NONREACTIVE

## 2015-07-21 LAB — HIV ANTIBODY (ROUTINE TESTING W REFLEX): HIV SCREEN 4TH GENERATION: NONREACTIVE

## 2016-01-04 ENCOUNTER — Encounter (HOSPITAL_COMMUNITY): Payer: Self-pay | Admitting: Emergency Medicine

## 2016-01-04 ENCOUNTER — Emergency Department (HOSPITAL_COMMUNITY)
Admission: EM | Admit: 2016-01-04 | Discharge: 2016-01-04 | Disposition: A | Discharge status: Home / Self Care | Payer: No Typology Code available for payment source | Attending: Physician Assistant | Admitting: Physician Assistant

## 2016-01-04 ENCOUNTER — Emergency Department (HOSPITAL_COMMUNITY): Payer: No Typology Code available for payment source

## 2016-01-04 DIAGNOSIS — Y939 Activity, unspecified: Secondary | ICD-10-CM | POA: Insufficient documentation

## 2016-01-04 DIAGNOSIS — W228XXA Striking against or struck by other objects, initial encounter: Secondary | ICD-10-CM | POA: Insufficient documentation

## 2016-01-04 DIAGNOSIS — Y929 Unspecified place or not applicable: Secondary | ICD-10-CM | POA: Insufficient documentation

## 2016-01-04 DIAGNOSIS — F17210 Nicotine dependence, cigarettes, uncomplicated: Secondary | ICD-10-CM | POA: Insufficient documentation

## 2016-01-04 DIAGNOSIS — Z79899 Other long term (current) drug therapy: Secondary | ICD-10-CM | POA: Insufficient documentation

## 2016-01-04 DIAGNOSIS — S93504A Unspecified sprain of right lesser toe(s), initial encounter: Secondary | ICD-10-CM | POA: Insufficient documentation

## 2016-01-04 DIAGNOSIS — Y999 Unspecified external cause status: Secondary | ICD-10-CM | POA: Insufficient documentation

## 2016-01-04 MED ORDER — IBUPROFEN 800 MG PO TABS: 800.0000 mg | ORAL_TABLET | Freq: Three times a day (TID) | ORAL | 0 refills | Status: DC
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
## 2016-01-04 NOTE — ED Triage Notes (Signed)
Pt stubbed R pinky toe last night. Ambulatory to room

## 2016-02-14 ENCOUNTER — Encounter (HOSPITAL_COMMUNITY): Payer: Self-pay | Admitting: Emergency Medicine

## 2016-02-14 ENCOUNTER — Emergency Department (HOSPITAL_COMMUNITY)
Admission: EM | Admit: 2016-02-14 | Discharge: 2016-02-14 | Disposition: A | Discharge status: Home / Self Care | Payer: No Typology Code available for payment source | Attending: Emergency Medicine | Admitting: Emergency Medicine

## 2016-02-14 DIAGNOSIS — Z79899 Other long term (current) drug therapy: Secondary | ICD-10-CM | POA: Insufficient documentation

## 2016-02-14 DIAGNOSIS — F17210 Nicotine dependence, cigarettes, uncomplicated: Secondary | ICD-10-CM | POA: Insufficient documentation

## 2016-02-14 DIAGNOSIS — B9789 Other viral agents as the cause of diseases classified elsewhere: Secondary | ICD-10-CM

## 2016-02-14 DIAGNOSIS — J069 Acute upper respiratory infection, unspecified: Secondary | ICD-10-CM | POA: Insufficient documentation

## 2016-02-14 MED ORDER — GUAIFENESIN-CODEINE 100-10 MG/5ML PO SOLN
5.0000 mL | Freq: Three times a day (TID) | ORAL | 0 refills | Status: DC | PRN
Start: 2016-02-14 — End: 2017-05-06
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
## 2016-02-14 NOTE — Discharge Instructions (Signed)
Please read attached information. If you experience any new or worsening signs or symptoms please return to the emergency room for evaluation. Please follow-up with your primary care provider or specialist as discussed. Please use medication prescribed only as directed and discontinue taking if you have any concerning signs or symptoms.   °

## 2016-02-14 NOTE — ED Triage Notes (Signed)
Pt reports productive cough x 1 week. Minimal relief with OTC meds. Stated productive cough, thick green secretions. Lung clear

## 2016-04-18 ENCOUNTER — Emergency Department (HOSPITAL_COMMUNITY)
Admission: EM | Admit: 2016-04-18 | Discharge: 2016-04-18 | Discharge status: Against Medical Advice | Payer: No Typology Code available for payment source

## 2016-04-18 NOTE — ED Notes (Signed)
Patient called for triage with no response.  

## 2017-01-18 ENCOUNTER — Emergency Department (HOSPITAL_COMMUNITY)
Admission: EM | Admit: 2017-01-18 | Discharge: 2017-01-18 | Disposition: A | Discharge status: Home / Self Care | Payer: No Typology Code available for payment source | Attending: Emergency Medicine | Admitting: Emergency Medicine

## 2017-01-18 ENCOUNTER — Encounter (HOSPITAL_COMMUNITY): Payer: Self-pay | Admitting: Emergency Medicine

## 2017-01-18 DIAGNOSIS — Z79899 Other long term (current) drug therapy: Secondary | ICD-10-CM | POA: Insufficient documentation

## 2017-01-18 DIAGNOSIS — F17210 Nicotine dependence, cigarettes, uncomplicated: Secondary | ICD-10-CM | POA: Insufficient documentation

## 2017-01-18 DIAGNOSIS — K047 Periapical abscess without sinus: Secondary | ICD-10-CM | POA: Insufficient documentation

## 2017-01-18 MED ORDER — NAPROXEN 500 MG PO TABS: 500.0000 mg | ORAL_TABLET | Freq: Two times a day (BID) | ORAL | 0 refills | Status: DC

## 2017-01-18 MED ORDER — AMOXICILLIN 500 MG PO CAPS: 500.0000 mg | ORAL_CAPSULE | Freq: Three times a day (TID) | ORAL | 0 refills | Status: DC

## 2017-01-18 NOTE — ED Triage Notes (Signed)
Patient c/o left lower dental abscess that started when she woke up this morning. Patient reports taking pills for pain on way here.

## 2017-01-18 NOTE — ED Provider Notes (Signed)
[facility]-EMERGENCY DEPT Provider Note   CSN: [id number] Arrival date & time: 01/18/17  1204     History   Chief Complaint Chief Complaint  Patient presents with  . Dental Pain    HPI Bonnie Stevenson is a 26 y.o. female who presents to the ED with pain to the left lower dental area that she thinks is an abscessed tooth. Patient woke with the pain this morning. She reports taking pain medication on the way to the ED.   HPI  Past Medical History:  Diagnosis Date  . Bacterial vaginosis   . GERD (gastroesophageal reflux disease)     There are no active problems to display for this patient.   Past Surgical History:  Procedure Laterality Date  . ESOPHAGEAL DILATION      OB History    No data available       Home Medications    Prior to Admission medications   Medication Sig Start Date End Date Taking? Authorizing Provider  amoxicillin (AMOXIL) 500 MG capsule Take 1 capsule (500 mg total) by mouth 3 (three) times daily. 01/18/17   Janne NapoleonNeese, Hope M, NP  guaiFENesin-codeine 100-10 MG/5ML syrup Take 5 mLs by mouth 3 (three) times daily as needed for cough. 02/14/16   Hedges, Tinnie GensJeffrey, PA-C  ibuprofen (ADVIL,MOTRIN) 800 MG tablet Take 1 tablet (800 mg total) by mouth 3 (three) times daily. 01/04/16   Danelle Berryapia, Leisa, PA-C  metroNIDAZOLE (FLAGYL) 500 MG tablet Take 500 mg by mouth 2 (two) times daily.    [provider]  naproxen (NAPROSYN) 500 MG tablet Take 1 tablet (500 mg total) by mouth 2 (two) times daily. 01/18/17   Janne NapoleonNeese, Hope M, NP    Family History Family History  Problem Relation Age of Onset  . Hypertension Other   . Cancer Mother     Social History Social History   Tobacco Use  . Smoking status: Current Every Day Smoker    Packs/day: 0.50    Types: Cigarettes  . Smokeless tobacco: Never Used  Substance Use Topics  . Alcohol use: Yes  . Drug use: No     Allergies   Other   Review of Systems Review of Systems   Constitutional: Negative for chills and fever.  HENT: Positive for dental problem and ear pain (left). Negative for sore throat.   All other systems reviewed and are negative.    Physical Exam Updated Vital Signs BP (!) 133/92 (BP Location: Left Arm)   Pulse 78   Temp 98.6 F (37 C) (Oral)   Resp 17   LMP 01/18/2017   SpO2 100%   Physical Exam  Constitutional: She is oriented to person, place, and time. She appears well-developed and well-nourished. No distress.  HENT:  Right Ear: Tympanic membrane normal.  Left Ear: Tympanic membrane normal.  Nose: Nose normal.  Mouth/Throat: Uvula is midline, oropharynx is clear and moist and mucous membranes are normal. Dental caries present.    Left lower second molar is broken, decayed and there is swelling of the gum surrounding the tooth. The area is tender on exam.  There is swelling to the face at the area of the left jaw.   Eyes: EOM are normal.  Neck: Neck supple.  Cardiovascular: Normal rate.  Pulmonary/Chest: Effort normal.  Abdominal: Soft. There is no tenderness.  Musculoskeletal: Normal range of motion.  Neurological: She is alert and oriented to person, place, and time. No cranial nerve deficit.  Skin: Skin is warm  and dry.  Psychiatric: She has a normal mood and affect. Her behavior is normal.  Nursing note and vitals reviewed.    ED Treatments / Results  Labs (all labs ordered are listed, but only abnormal results are displayed) Labs Reviewed - No data to display  Radiology No results found.  Procedures Procedures (including critical care time)  Medications Ordered in ED Medications - No data to display   Initial Impression / Assessment and Plan / ED Course  I have reviewed the triage vital signs and the nursing notes. Patient with toothache.  No gross abscess.  Exam unconcerning for Ludwig's angina or spread of infection.  Will treat with penicillin and anti-inflammatories medicine.  Urged patient to  follow-up with dentist.  Resource dental guide given.  Final Clinical Impressions(s) / ED Diagnoses   Final diagnoses:  Dental abscess    ED Discharge Orders        Ordered    amoxicillin (AMOXIL) 500 MG capsule  3 times daily     01/18/17 1456    naproxen (NAPROSYN) 500 MG tablet  2 times daily     01/18/17 1456       Kerrie Buffaloeese, Hope [place] Ac[place], [place] 01/18/17 1501    Donnetta Hutchingook, Brian, MD 01/19/17 1408

## 2017-05-06 ENCOUNTER — Encounter: Payer: Self-pay | Admitting: Family Medicine

## 2017-05-06 ENCOUNTER — Ambulatory Visit (INDEPENDENT_AMBULATORY_CARE_PROVIDER_SITE_OTHER): Payer: Self-pay | Admitting: Family Medicine

## 2017-05-06 VITALS — BP 117/77 | HR 83 | Temp 97.8°F | Resp 16 | Ht 65.0 in | Wt 178.0 lb

## 2017-05-06 DIAGNOSIS — K219 Gastro-esophageal reflux disease without esophagitis: Secondary | ICD-10-CM

## 2017-05-06 DIAGNOSIS — R102 Pelvic and perineal pain: Secondary | ICD-10-CM

## 2017-05-06 DIAGNOSIS — Z803 Family history of malignant neoplasm of breast: Secondary | ICD-10-CM

## 2017-05-06 DIAGNOSIS — Z01419 Encounter for gynecological examination (general) (routine) without abnormal findings: Secondary | ICD-10-CM

## 2017-05-06 DIAGNOSIS — Z23 Encounter for immunization: Secondary | ICD-10-CM

## 2017-05-06 DIAGNOSIS — N921 Excessive and frequent menstruation with irregular cycle: Secondary | ICD-10-CM

## 2017-05-06 DIAGNOSIS — Z202 Contact with and (suspected) exposure to infections with a predominantly sexual mode of transmission: Secondary | ICD-10-CM

## 2017-05-06 LAB — POCT URINALYSIS DIP (DEVICE)
Bilirubin Urine: NEGATIVE
Glucose, UA: NEGATIVE mg/dL
Hgb urine dipstick: NEGATIVE
Ketones, ur: NEGATIVE mg/dL
Leukocytes, UA: NEGATIVE
Nitrite: NEGATIVE
PROTEIN: NEGATIVE mg/dL
UROBILINOGEN UA: 0.2 mg/dL (ref 0.0–1.0)
pH: 5.5 (ref 5.0–8.0)

## 2017-05-06 MED ORDER — OMEPRAZOLE 20 MG PO CPDR: 20.0000 mg | DELAYED_RELEASE_CAPSULE | Freq: Every day | ORAL | 1 refills | Status: DC

## 2017-05-06 NOTE — Progress Notes (Signed)
Chief Complaint  Patient presents with  . Establish Care  . Vaginal Discharge  . Vaginal Itching    burning     Subjective:    Patient ID: Bonnie Stevenson, female    DOB: Jun 20, 1990, 27 y.o.   MRN: [id number]  HPI A 27 year old female presents to establish care.  Patient states that she has not had a primary provider and has mostly been using the emergency room or urgent care for primary needs.  Patient is currently complaining of vaginal itching, vaginal burning, and pelvic pain.  She also endorses menorrhagia.  Patient states that menstrual cycles typically last from 7-10 days and are heavy.  Patient typically saturates 7-8 pads per day.  Patient states that she is sexually active with one partner.  She currently denies fever, headache, nausea, vomiting, or diarrhea  Paitent complains of heartburn. This has been associated with belching and heartburn.  She denies bilious reflux, choking on food, cough, deep pressure at base of neck and difficulty swallowing. Symptoms have been present for several years. She denies dysphagia.  She has not lost weight.  Medical therapy in the past has included antacids without sustained relief.   Past Medical History:  Diagnosis Date  . Bacterial vaginosis   . GERD (gastroesophageal reflux disease)    Social History   Socioeconomic History  . Marital status: Single    Spouse name: Not on file  . Number of children: Not on file  . Years of education: Not on file  . Highest education level: Not on file  Social Needs  . Financial resource strain: Not on file  . Food insecurity - worry: Not on file  . Food insecurity - inability: Not on file  . Transportation needs - medical: Not on file  . Transportation needs - non-medical: Not on file  Occupational History  . Not on file  Tobacco Use  . Smoking status: Former Smoker    Packs/day: 0.50    Types: Cigarettes  . Smokeless tobacco: Never Used  Substance and Sexual Activity  . Alcohol use:  No    Frequency: Never  . Drug use: No  . Sexual activity: Yes    Birth control/protection: None  Other Topics Concern  . Not on file  Social History Narrative  . Not on file   Immunization History  Administered Date(s) Administered  . Tdap 05/06/2017   Review of Systems  Constitutional: Negative for diaphoresis and fatigue.  Cardiovascular: Negative.   Gastrointestinal: Negative.   Genitourinary: Positive for dysuria, menstrual problem and pelvic pain. Negative for decreased urine volume, hematuria and vaginal bleeding.  Musculoskeletal: Negative.   Neurological: Negative for weakness.  Hematological: Negative.   Psychiatric/Behavioral: Negative.        Objective:   Physical Exam  Constitutional: She appears well-nourished.  HENT:  Head: Normocephalic.  Eyes: Pupils are equal, round, and reactive to light.  Neck: Normal range of motion.  Cardiovascular: Normal rate, regular rhythm, normal heart sounds and intact distal pulses.  Pulmonary/Chest: Effort normal and breath sounds normal.  Abdominal: Soft. There is tenderness.  Genitourinary: There is no tenderness on the left labia. Uterus is not deviated. Cervix exhibits discharge. Cervix exhibits no motion tenderness and no friability. Right adnexum displays no mass. No erythema in the vagina. Vaginal discharge found.  Neurological: She is alert.  Skin: Skin is warm and dry.  Psychiatric: She has a normal mood and affect. Her behavior is normal. Judgment and thought content normal.  BP 117/77 (BP Location: Left Arm, Patient Position: Sitting, Cuff Size: Normal)   Pulse 83   Temp 97.8 F (36.6 C) (Oral)   Resp 16   Ht 5\' 5"  (1.651 m)   Wt 178 lb (80.7 kg)   LMP 04/02/2017   BMI 29.62 kg/m  Assessment & Plan:  1. Possible exposure to STD - POCT urinalysis dip (device) - HIV antibody (with reflex) - RPR - Chlamydia/Gonococcus/Trichomonas, NAA  2. Menorrhagia with irregular cycle - US PELVIC COMPLETE  WITH TRANSVAGINAL; Future  3. Pap smear, as part of routine gynecological examination - POCT urinalysis dip (device) - IGP, rfx Aptima HPV ASCU  4. Family history of breast cancer Patient's mother has a history of breast cancer. Patient does not recall age of diagnosis. She says that she will gather that information, so that mammogram can be scheduled.   5. Need for Tdap vaccination  - Tdap vaccine greater than or equal to 7yo IM  6. Pelvic pain - POCT urinalysis dip (device) - Chlamydia/Gonococcus/Trichomonas, NAA - US PELVIC COMPLETE WITH TRANSVAGINAL; Future  7. Gastroesophageal reflux disease without esophagitis - omeprazole (PRILOSEC) 20 MG capsule; Take 1 capsule (20 mg total) by mouth daily.  Dispense: 30 capsule; Refill: 1   RTC; 6 months for CPE   Nolon NationsLachina Moore Zayn Selley  MSN, FNP-C Patient [facility] [address]  [place], [place] [id number] [phone]

## 2017-05-06 NOTE — Patient Instructions (Signed)
Will follow up by phone with any abnormal laboratory results Will review pelvic/transvaginal ultrasound as results become available.  Recommend a lowfat, low carbohydrate diet divided over 5-6 small meals, increase water intake to 6-8 glasses, and 150 minutes per week of cardiovascular exercise.    Menorrhagia Menorrhagia is when your menstrual periods are heavy or last longer than usual. Follow these instructions at home:  Only take medicine as told by your doctor.  Take any iron pills as told by your doctor. Heavy bleeding may cause low levels of iron in your body.  Do not take aspirin 1 week before or during your period. Aspirin can make the bleeding worse.  Lie down for a while if you change your tampon or pad more than once in 2 hours. This may help lessen the bleeding.  Eat a healthy diet and foods with iron. These foods include leafy green vegetables, meat, liver, eggs, and whole grain breads and cereals.  Do not try to lose weight. Wait until the heavy bleeding has stopped and your iron level is normal. Contact a doctor if:  You soak through a pad or tampon every 1 or 2 hours, and this happens every time you have a period.  You need to use pads and tampons at the same time because you are bleeding so much.  You need to change your pad or tampon during the night.  You have a period that lasts for more than 8 days.  You pass clots bigger than 1 inch (2.5 cm) wide.  You have irregular periods that happen more or less often than once a month.  You feel dizzy or pass out (faint).  You feel very weak or tired.  You feel short of breath or feel your heart is beating too fast when you exercise.  You feel sick to your stomach (nausea) and you throw up (vomit) while you are taking your medicine.  You have watery poop (diarrhea) while you are taking your medicine.  You have any problems that may be related to the medicine you are taking. Get help right away if:  You soak  through 4 or more pads or tampons in 2 hours.  You have any bleeding while you are pregnant. This information is not intended to replace advice given to you by your health care provider. Make sure you discuss any questions you have with your health care provider. Document Released: 11/25/2007 Document Revised: 07/24/2015 Document Reviewed: 08/17/2012 Elsevier Interactive Patient Education  2017 [employer]

## 2017-05-07 LAB — HIV ANTIBODY (ROUTINE TESTING W REFLEX): HIV Screen 4th Generation wRfx: NONREACTIVE

## 2017-05-07 LAB — RPR: RPR: NONREACTIVE

## 2017-05-08 LAB — CHLAMYDIA/GONOCOCCUS/TRICHOMONAS, NAA
CHLAMYDIA BY NAA: NEGATIVE
Gonococcus by NAA: NEGATIVE
Trich vag by NAA: NEGATIVE

## 2017-05-11 ENCOUNTER — Ambulatory Visit (HOSPITAL_COMMUNITY)
Admission: RE | Admit: 2017-05-11 | Discharge: 2017-05-11 | Disposition: A | Discharge status: Home / Self Care | Payer: Medicaid Other | Source: Ambulatory Visit | Attending: Family Medicine | Admitting: Family Medicine

## 2017-05-11 DIAGNOSIS — N83202 Unspecified ovarian cyst, left side: Secondary | ICD-10-CM | POA: Insufficient documentation

## 2017-05-11 DIAGNOSIS — R102 Pelvic and perineal pain: Secondary | ICD-10-CM | POA: Insufficient documentation

## 2017-05-11 DIAGNOSIS — N921 Excessive and frequent menstruation with irregular cycle: Secondary | ICD-10-CM | POA: Insufficient documentation

## 2017-05-11 LAB — IGP, RFX APTIMA HPV ASCU: PAP Smear Comment: 0

## 2017-05-23 ENCOUNTER — Telehealth: Payer: Self-pay

## 2017-05-23 NOTE — Telephone Encounter (Signed)
[place] Please advise. Thanks!  

## 2017-05-24 ENCOUNTER — Other Ambulatory Visit: Payer: Self-pay | Admitting: Family Medicine

## 2017-05-24 ENCOUNTER — Encounter: Payer: Self-pay | Admitting: Family Medicine

## 2017-05-24 DIAGNOSIS — R102 Pelvic and perineal pain: Secondary | ICD-10-CM

## 2017-05-24 NOTE — Progress Notes (Signed)
Melton Alarierra McCormic, a 27 year old female with a history of menorrhagia and pelvic pain established care on 3.09/2017.  Reviewed transvaginal ultrasound, which showed the following:  IMPRESSION: 1. 1.5 cm mildly complex left ovarian cyst, indeterminate, but favored to reflect a hemorrhagic cyst with retractile clot. A short interval follow-up ultrasound in 6-12 weeks is suggested to ensure resolution. 2. No other acute abnormality within the pelvis. 3. Normal sonographic appearance of the uterus and endometrium. 4. Normal right ovary.   Will order a transvaginal ultrasound in 12 weeks to ensure resolution of left ovarian cyst.   Nolon NationsLachina Moore Breyton Vanscyoc  MSN, FNP-C Patient Care [facility] [address]  [place], [place] [id number] [phone]

## 2017-05-25 NOTE — Progress Notes (Signed)
Informed patient that the ultrasound was reviewed and a 1.5cm cyst was found of left ovary. Reccommended that we repeat ultrasound in 12 weeks to ensure resolution. Patient verbalized understanding. Thanks!

## 2017-05-26 ENCOUNTER — Encounter (HOSPITAL_COMMUNITY): Payer: Self-pay

## 2017-05-26 ENCOUNTER — Other Ambulatory Visit: Payer: Self-pay

## 2017-05-26 ENCOUNTER — Emergency Department (HOSPITAL_COMMUNITY)
Admission: EM | Admit: 2017-05-26 | Discharge: 2017-05-26 | Disposition: A | Discharge status: Home / Self Care | Payer: Medicaid Other | Attending: Emergency Medicine | Admitting: Emergency Medicine

## 2017-05-26 DIAGNOSIS — B9789 Other viral agents as the cause of diseases classified elsewhere: Secondary | ICD-10-CM

## 2017-05-26 DIAGNOSIS — Z87891 Personal history of nicotine dependence: Secondary | ICD-10-CM | POA: Insufficient documentation

## 2017-05-26 DIAGNOSIS — L309 Dermatitis, unspecified: Secondary | ICD-10-CM

## 2017-05-26 DIAGNOSIS — J069 Acute upper respiratory infection, unspecified: Secondary | ICD-10-CM

## 2017-05-26 HISTORY — DX: Unspecified ovarian cyst, unspecified side: N83.209

## 2017-05-26 LAB — POC URINE PREG, ED: Preg Test, Ur: NEGATIVE

## 2017-05-26 MED ORDER — PROMETHAZINE-DM 6.25-15 MG/5ML PO SYRP: 2.5000 mL | ORAL_SOLUTION | Freq: Four times a day (QID) | ORAL | 0 refills | Status: DC | PRN

## 2017-05-26 MED ORDER — FLUTICASONE PROPIONATE 50 MCG/ACT NA SUSP: 1.0000 | Freq: Every day | NASAL | 2 refills | Status: DC

## 2017-05-26 MED ORDER — GUAIFENESIN ER 600 MG PO TB12: 600.0000 mg | ORAL_TABLET | Freq: Two times a day (BID) | ORAL | 0 refills | Status: AC

## 2017-05-26 MED ORDER — HYDROCORTISONE 1 % EX CREA: TOPICAL_CREAM | CUTANEOUS | 0 refills | Status: DC

## 2017-05-26 NOTE — ED Notes (Signed)
Bed: WTR7 Expected date:  Expected time:  Means of arrival:  Comments: 

## 2017-05-26 NOTE — Discharge Instructions (Signed)
Please read and follow all provided instructions.  Your diagnoses today include:  1. Viral URI with cough   2. Dermatitis     You appear to have an upper respiratory infection (URI). An upper respiratory tract infection, or cold, is a viral infection of the air passages leading to the lungs. It should improve gradually after 5-7 days. You may have a lingering cough that lasts for 2- 4 weeks after the infection.  Tests performed today include: Vital signs. See below for your results today.   Medications prescribed:   Take any prescribed medications only as directed. Treatment for your infection is aimed at treating the symptoms. There are no medications, such as antibiotics, that will cure your infection.   Please take 2.5 mL of the Phenergan--Phenergan cough syrup.  This medication can make you sleepy, so do not drive, drink operate machinery while taking it.  Also make sure that this stays away from children.  Please take Mucinex twice daily.  This will help loosen the mucus in your lungs.  Please place the Flonase in each nostril once daily.  Please use 25 mg of over-the-counter Benadryl every 6 hours as needed for itching.  This can make you sleepy so do not drive, drink operate machinery while taking it.  Please use the hydrocortisone cream twice daily in the areas were your itching.  Home care instructions:  Follow any educational materials contained in this packet.   Your illness is contagious and can be spread to others, especially during the first 3 or 4 days. It cannot be cured by antibiotics or other medicines. Take basic precautions such as washing your hands often, covering your mouth when you cough or sneeze, and avoiding public places where you could spread your illness to others.   Please continue drinking plenty of fluids.  Use over-the-counter medicines as needed as directed on packaging for symptom relief.  You may also use ibuprofen or tylenol as directed on  packaging for pain or fever.  Do not take multiple medicines containing Tylenol or acetaminophen to avoid taking too much of this medication.  Follow-up instructions: Please follow-up with your primary care provider in the next 3 days for further evaluation of your symptoms if you are not feeling better.   Return instructions:  Please return to the Emergency Department if you experience worsening symptoms.  RETURN IMMEDIATELY IF you develop shortness of breath, chest pain, confusion or altered mental status, a new rash, become dizzy, faint, or poorly responsive, or are unable to be cared for at home. Please return if you have persistent vomiting and cannot keep down fluids or develop a fever that is not controlled by tylenol or motrin.   Please return if you have any other emergent concerns.  Additional Information:  Your vital signs today were: BP 122/77 (BP Location: Left Arm)    Pulse 72    Temp 97.6 F (36.4 C) (Oral)    Resp 14    Ht 5\' 5"  (1.651 m)    Wt 80.7 kg (178 lb)    LMP 05/26/2017    SpO2 96%    BMI 29.62 kg/m  If your blood pressure (BP) was elevated above 135/85 this visit, please have this repeated by your doctor within one month. --------------

## 2017-05-26 NOTE — ED Provider Notes (Signed)
[facility]-EMERGENCY DEPT Provider Note   CSN: [id number] Arrival date & time: 05/26/17  1127     History   Chief Complaint Chief Complaint  Patient presents with  . Cough  . Nasal Congestion  . Generalized Body Aches    HPI Bonnie Stevenson is a 27 y.o. female.  HPI  Patient is a 27 year old female with a history of ovarian cyst presenting for cough, congestion, rhinorrhea, body aches and nausea for 2 days.  Patient reports that her symptoms began with nausea 2 days ago, but she did not vomit until today at work, which prompted her evaluation.  Patient reports that she coughed so hard that she made herself vomit.  Patient denies bloody or bilious emesis.  Patient reports that she also began having nasal congestion 2 days ago with clear nasal drainage.  Patient reports some sensation of fullness particularly in her right ear.  Patient reports a mild sore throat, but no obstructive swallowing or difficulty breathing.  Patient is also reporting that she developed some "bumps" on the dorsum of her right thumb.  Patient subsequently noted that she had a "bump on her left shoulder.  Patient reports these lesions are pruritic.  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  Patient denies any fevers or chills since this incident.  No recent sick contacts.  No history of immunocompromise status.  No headaches or neck stiffness.  Patient reports she took an unknown over-the-counter cough and cold medication last night, but nothing today.  Past Medical History:  Diagnosis Date  . Bacterial vaginosis   . GERD (gastroesophageal reflux disease)   . Ovarian cyst     There are no active problems to display for this patient.   Past Surgical History:  Procedure Laterality Date  . ESOPHAGEAL DILATION       OB History   None      Home Medications    Prior to Admission medications     Medication Sig Start Date End Date Taking? Authorizing Provider  omeprazole (PRILOSEC) 20 MG capsule Take 1 capsule (20 mg total) by mouth daily. 05/06/17  Yes Massie Maroon, FNP    Family History Family History  Problem Relation Age of Onset  . Hypertension Other   . Cancer Mother     Social History Social History   Tobacco Use  . Smoking status: Former Smoker    Packs/day: 0.50    Types: Cigarettes  . Smokeless tobacco: Never Used  Substance Use Topics  . Alcohol use: No    Frequency: Never  . Drug use: No     Allergies   Other   Review of Systems Review of Systems  Constitutional: Negative for chills and fever.  HENT: Positive for congestion, rhinorrhea and sinus pressure.   Respiratory: Positive for cough. Negative for shortness of breath.   Gastrointestinal: Positive for nausea and vomiting. Negative for abdominal pain.  Skin: Positive for rash.  Neurological: Negative for headaches.     Physical Exam Updated Vital Signs BP 122/77 (BP Location: Left Arm)   Pulse 72   Temp 97.6 F (36.4 C) (Oral)   Resp 14   Ht 5\' 5"  (1.651 m)   Wt 80.7 kg (178 lb)   LMP 05/26/2017   SpO2 96%   BMI 29.62 kg/m   Physical Exam  Constitutional: She appears well-developed and well-nourished. No distress.  Sitting comfortably in bed.  HENT:  Head: Normocephalic and atraumatic.  Mouth/Throat: Oropharynx is clear and moist.  Right TM with mild serous effusion but no loss of bony landmarks. Left TM with no effusion and good visualization of bony land marks.  Eyes: Conjunctivae are normal. Right eye exhibits no discharge. Left eye exhibits no discharge.  EOMs normal to gross examination.  Neck: Normal range of motion.  Cardiovascular: Normal rate and regular rhythm.  Intact, 2+ radial pulse.  Pulmonary/Chest: Effort normal and breath sounds normal. She has no wheezes. She has no rales.  Normal respiratory effort. Patient converses comfortably. No audible wheeze or  stridor.  Abdominal: She exhibits no distension. There is no tenderness.  Musculoskeletal: Normal range of motion.  Neurological: She is alert.  Cranial nerves intact to gross observation. Patient moves extremities without difficulty.  Skin: Skin is warm and dry. She is not diaphoretic.  Over the anatomic snuffbox, there is an erythematous cluster of small papules.  No vesicular lesions. There is a singular raised nodular area of the left shoulder with surrounding erythema.  No other lesions of the extremities, or trunk.  Psychiatric: She has a normal mood and affect. Her behavior is normal. Judgment and thought content normal.  Nursing note and vitals reviewed.    ED Treatments / Results  Labs (all labs ordered are listed, but only abnormal results are displayed) Labs Reviewed  POC URINE PREG, ED    EKG None  Radiology No results found.  Procedures Procedures (including critical care time)  Medications Ordered in ED Medications - No data to display   Initial Impression / Assessment and Plan / ED Course  I have reviewed the triage vital signs and the nursing notes.  Pertinent labs & imaging results that were available during my care of the patient were reviewed by me and considered in my medical decision making (see chart for details).    Patient is nontoxic-appearing and in no acute distress.  Likely viral syndrome.  Lungs are clear.  Clinical syndrome not suggestive of influenza, and patient has no pertinent risk factors requiring antiviral treatment.  Episode of emesis today likely posttussive.  Rashes not suggestive of etiology such as meningitis, Stevens-Johnson syndrome, [facility] spotted fever, Lyme disease.  Looks to be contact dermatitis and is not diffuse in nature.  Patient is well-appearing, and ambulating in the emergency department without difficulty. We will treat with symptomatic control.  I encouraged p.o. intake to patient.  Return precautions given for  any increasing dizziness, lightheadedness, fevers, chills, neck stiffness, headaches, or intractable nausea or vomiting.  Patient is in understanding and agrees with the plan of care.  Final Clinical Impressions(s) / ED Diagnoses   Final diagnoses:  Viral URI with cough  Dermatitis    ED Discharge Orders        Ordered    promethazine-dextromethorphan (PROMETHAZINE-DM) 6.25-15 MG/5ML syrup  4 times daily PRN     05/26/17 1303    guaiFENesin (MUCINEX) 600 MG 12 hr tablet  2 times daily     05/26/17 1303    fluticasone (FLONASE) 50 MCG/ACT nasal spray  Daily     05/26/17 1303    hydrocortisone cream 1 %     05/26/17 1303       Delia ChimesMurray, Rosezetta Balderston B, PA-C 05/26/17 1431    Jacalyn LefevreHaviland, Julie, MD 05/26/17 1458

## 2017-05-26 NOTE — ED Triage Notes (Signed)
Patient c/o nasal congestion, cough, and body aches x 2 days.

## 2017-06-08 ENCOUNTER — Ambulatory Visit (INDEPENDENT_AMBULATORY_CARE_PROVIDER_SITE_OTHER): Payer: Self-pay | Admitting: Family Medicine

## 2017-06-08 ENCOUNTER — Encounter: Payer: Self-pay | Admitting: Family Medicine

## 2017-06-08 VITALS — BP 120/76 | Ht 65.0 in | Wt 174.0 lb

## 2017-06-08 DIAGNOSIS — K219 Gastro-esophageal reflux disease without esophagitis: Secondary | ICD-10-CM

## 2017-06-08 DIAGNOSIS — N898 Other specified noninflammatory disorders of vagina: Secondary | ICD-10-CM

## 2017-06-08 DIAGNOSIS — Z3040 Encounter for surveillance of contraceptives, unspecified: Secondary | ICD-10-CM

## 2017-06-08 LAB — POCT URINALYSIS DIP (MANUAL ENTRY)
Bilirubin, UA: NEGATIVE
GLUCOSE UA: NEGATIVE mg/dL
Leukocytes, UA: NEGATIVE
Nitrite, UA: NEGATIVE
Protein Ur, POC: NEGATIVE mg/dL
Urobilinogen, UA: 0.2 E.U./dL
pH, UA: 6.5 (ref 5.0–8.0)

## 2017-06-08 LAB — POCT URINE PREGNANCY: Preg Test, Ur: NEGATIVE

## 2017-06-08 MED ORDER — MEDROXYPROGESTERONE ACETATE 150 MG/ML IM SUSP
150.0000 mg | Freq: Once | INTRAMUSCULAR | Status: AC
  Administered 2017-06-08: 150 mg via INTRAMUSCULAR

## 2017-06-08 MED ORDER — PANTOPRAZOLE SODIUM 20 MG PO TBEC: 20.0000 mg | DELAYED_RELEASE_TABLET | Freq: Every day | ORAL | 1 refills | Status: DC

## 2017-06-08 NOTE — Patient Instructions (Addendum)
We will start an 8-week trial of Protonix 20 mg every a.m. for GERD.  Recommend that you decrease the acidity in your diet.  Discussed diet options at length and I will provide written information.   We will follow-up by phone with any abnormal laboratory results   Return in 3 months for depo provera     Food Choices for Gastroesophageal Reflux Disease, Adult When you have gastroesophageal reflux disease (GERD), the foods you eat and your eating habits are very important. Choosing the right foods can help ease your discomfort. What guidelines do I need to follow?  Choose fruits, vegetables, whole grains, and low-fat dairy products.  Choose low-fat meat, fish, and poultry.  Limit fats such as oils, salad dressings, butter, nuts, and avocado.  Keep a food diary. This helps you identify foods that cause symptoms.  Avoid foods that cause symptoms. These may be different for everyone.  Eat small meals often instead of 3 large meals a day.  Eat your meals slowly, in a place where you are relaxed.  Limit fried foods.  Cook foods using methods other than frying.  Avoid drinking alcohol.  Avoid drinking large amounts of liquids with your meals.  Avoid bending over or lying down until 2-3 hours after eating. What foods are not recommended? These are some foods and drinks that may make your symptoms worse: Vegetables Tomatoes. Tomato juice. Tomato and spaghetti sauce. Chili peppers. Onion and garlic. Horseradish. Fruits Oranges, grapefruit, and lemon (fruit and juice). Meats High-fat meats, fish, and poultry. This includes hot dogs, ribs, ham, sausage, salami, and bacon. Dairy Whole milk and chocolate milk. Sour cream. Cream. Butter. Ice cream. Cream cheese. Drinks Coffee and tea. Bubbly (carbonated) drinks or energy drinks. Condiments Hot sauce. Barbecue sauce. Sweets/Desserts Chocolate and cocoa. Donuts. Peppermint and spearmint. Fats and Oils High-fat foods. This  includes [place] fries and potato chips. Other Vinegar. Strong spices. This includes black pepper, white pepper, red pepper, cayenne, curry powder, cloves, ginger, and chili powder. The items listed above may not be a complete list of foods and drinks to avoid. Contact your dietitian for more information. This information is not intended to replace advice given to you by your health care provider. Make sure you discuss any questions you have with your health care provider. Document Released: 08/17/2011 Document Revised: 07/24/2015 Document Reviewed: 12/20/2012 Elsevier Interactive Patient Education  2017 Elsevier Inc.  Gastroesophageal Reflux Disease, Adult Normally, food travels down the esophagus and stays in the stomach to be digested. If a person has gastroesophageal reflux disease (GERD), food and stomach acid move back up into the esophagus. When this happens, the esophagus becomes sore and swollen (inflamed). Over time, GERD can make small holes (ulcers) in the lining of the esophagus. Follow these instructions at home: Diet  Follow a diet as told by your doctor. You may need to avoid foods and drinks such as: ? Coffee and tea (with or without caffeine). ? Drinks that contain alcohol. ? Energy drinks and sports drinks. ? Carbonated drinks or sodas. ? Chocolate and cocoa. ? Peppermint and mint flavorings. ? Garlic and onions. ? Horseradish. ? Spicy and acidic foods, such as peppers, chili powder, curry powder, vinegar, hot sauces, and BBQ sauce. ? Citrus fruit juices and citrus fruits, such as oranges, lemons, and limes. ? Tomato-based foods, such as red sauce, chili, salsa, and pizza with red sauce. ? Fried and fatty foods, such as donuts, french fries, potato chips, and high-fat dressings. ? High-fat meats, such  as hot dogs, rib eye steak, sausage, ham, and bacon. ? High-fat dairy items, such as whole milk, butter, and cream cheese.  Eat small meals often. Avoid eating large  meals.  Avoid drinking large amounts of liquid with your meals.  Avoid eating meals during the 2-3 hours before bedtime.  Avoid lying down right after you eat.  Do not exercise right after you eat. General instructions  Pay attention to any changes in your symptoms.  Take over-the-counter and prescription medicines only as told by your doctor. Do not take aspirin, ibuprofen, or other NSAIDs unless your doctor says it is okay.  Do not use any tobacco products, including cigarettes, chewing tobacco, and e-cigarettes. If you need help quitting, ask your doctor.  Wear loose clothes. Do not wear anything tight around your waist.  Raise (elevate) the head of your bed about 6 inches (15 cm).  Try to lower your stress. If you need help doing this, ask your doctor.  If you are overweight, lose an amount of weight that is healthy for you. Ask your doctor about a safe weight loss goal.  Keep all follow-up visits as told by your doctor. This is important. Contact a doctor if:  You have new symptoms.  You lose weight and you do not know why it is happening.  You have trouble swallowing, or it hurts to swallow.  You have wheezing or a cough that keeps happening.  Your symptoms do not get better with treatment.  You have a hoarse voice. Get help right away if:  You have pain in your arms, neck, jaw, teeth, or back.  You feel sweaty, dizzy, or light-headed.  You have chest pain or shortness of breath.  You throw up (vomit) and your throw up looks like blood or coffee grounds.  You pass out (faint).  Your poop (stool) is bloody or black.  You cannot swallow, drink, or eat. This information is not intended to replace advice given to you by your health care provider. Make sure you discuss any questions you have with your health care provider. Document Released: 08/04/2007 Document Revised: 07/24/2015 Document Reviewed: 06/12/2014 Elsevier Interactive Patient Education  [employer]2018  Elsevier Inc.

## 2017-06-08 NOTE — Progress Notes (Signed)
Subjective:    Bonnie Stevenson is a 27 y.o. female who presents for contraception counseling. Patient is requesting depo provera. She has taken in the past without complication. Patient is sexually active with one partner. She is complaining of vaginal discharge and odor on today. Last menstrual period 06/04/2017. Melton Alarierra denies current smoker, hypertension, liver disease, migraines, pelvic inflammatory disease, sexually transmitted diseases, thromboembolism and urinary tract infections.   Paitent complains of worsening heartburn.  This has been associated with belching and eructation, choking on food, heartburn and nausea.  She denies difficulty swallowing, fullness after meals, hoarseness, melena, need to clear throat frequently and nocturnal burning. Symptoms have been present for several months. She denies dysphagia.  She has not lost weight. She denies melena, hematochezia, hematemesis, and coffee ground emesis. A trial of omeprazole was started one month ago without relief.   Menstrual History: OB History   None     Past Medical History:  Diagnosis Date  . Bacterial vaginosis   . GERD (gastroesophageal reflux disease)   . Ovarian cyst    Immunization History  Administered Date(s) Administered  . Tdap 05/06/2017   Social History   Socioeconomic History  . Marital status: Single    Spouse name: Not on file  . Number of children: Not on file  . Years of education: Not on file  . Highest education level: Not on file  Occupational History  . Not on file  Social Needs  . Financial resource strain: Not on file  . Food insecurity:    Worry: Not on file    Inability: Not on file  . Transportation needs:    Medical: Not on file    Non-medical: Not on file  Tobacco Use  . Smoking status: Former Smoker    Packs/day: 0.50    Types: Cigarettes  . Smokeless tobacco: Never Used  Substance and Sexual Activity  . Alcohol use: No    Frequency: Never  . Drug use: No  . Sexual  activity: Yes    Birth control/protection: None  Lifestyle  . Physical activity:    Days per week: Not on file    Minutes per session: Not on file  . Stress: Not on file  Relationships  . Social connections:    Talks on phone: Not on file    Gets together: Not on file    Attends religious service: Not on file    Active member of club or organization: Not on file    Attends meetings of clubs or organizations: Not on file    Relationship status: Not on file  . Intimate partner violence:    Fear of current or ex partner: Not on file    Emotionally abused: Not on file    Physically abused: Not on file    Forced sexual activity: Not on file  Other Topics Concern  . Not on file  Social History Narrative  . Not on file   Patient's last menstrual period was 06/04/2017.   Review of Systems  Constitutional: Negative.   HENT: Negative.   Cardiovascular: Negative.   Gastrointestinal:       Heartburn and belching  Genitourinary: Negative.        Vaginal discharge and odor  Musculoskeletal: Negative.   Skin: Negative.   Psychiatric/Behavioral: Negative.     Objective:  Physical Exam  Constitutional: She is oriented to person, place, and time. She appears well-developed and well-nourished.  HENT:  Head: Normocephalic and atraumatic.  Eyes: Pupils are  equal, round, and reactive to light.  Neck: Normal range of motion.  Cardiovascular: Normal rate, regular rhythm and normal heart sounds.  Pulmonary/Chest: Effort normal and breath sounds normal.  Abdominal: Soft. Bowel sounds are normal.  Neurological: She is alert and oriented to person, place, and time.  Skin: Skin is warm and dry.  Psychiatric: She has a normal mood and affect. Her behavior is normal. Judgment and thought content normal.    Assessment:    27 y.o., starting Depo-Provera injections, no contraindications.   Plan:  Encounter for surveillance of contraceptives, unspecified contraceptive - POCT urinalysis  dipstick - POCT urine pregnancy - medroxyPROGESTERone (DEPO-PROVERA) injection 150 mg  Gastroesophageal reflux disease without esophagitis - pantoprazole (PROTONIX) 20 MG tablet; Take 1 tablet (20 mg total) by mouth daily.  Dispense: 30 tablet; Refill: 1  Vaginal discharge - Vaginitis/Vaginosis, DNA Probe  Vaginal odor - Vaginitis/Vaginosis, DNA Probe    All questions answered. Breast self exam technique reviewed and patient encouraged to perform self-exam monthly. Chlamydia specimen. Discussed healthy lifestyle modifications. Agricultural engineer distributed. Follow up as needed.     RTC: 3 months for depo provera injection  Nolon Nations  MSN, FNP-C Patient Care [facility] [address] [place], [place] [address] [phone]

## 2017-06-13 LAB — VAGINITIS/VAGINOSIS, DNA PROBE

## 2017-06-16 ENCOUNTER — Telehealth: Payer: Self-pay

## 2017-06-16 ENCOUNTER — Other Ambulatory Visit: Payer: Self-pay | Admitting: Family Medicine

## 2017-06-16 DIAGNOSIS — N760 Acute vaginitis: Principal | ICD-10-CM

## 2017-06-16 DIAGNOSIS — B9689 Other specified bacterial agents as the cause of diseases classified elsewhere: Secondary | ICD-10-CM

## 2017-06-16 MED ORDER — METRONIDAZOLE 500 MG PO TABS: 500.0000 mg | ORAL_TABLET | Freq: Two times a day (BID) | ORAL | 0 refills | Status: DC

## 2017-06-16 NOTE — Progress Notes (Signed)
Meds ordered this encounter  Medications  . metroNIDAZOLE (FLAGYL) 500 MG tablet    Sig: Take 1 tablet (500 mg total) by mouth 2 (two) times daily.    Dispense:  14 tablet    Refill:  0     Jurnie Garritano Moore Elizibeth Breau  MSN, FNP-C Patient [facility] [address]  [place], [place] [address] [phone]  

## 2017-06-16 NOTE — Telephone Encounter (Signed)
-----   Message from Massie MaroonLachina M Hollis, [place] sent at 06/16/2017  4:29 PM EDT ----- Regarding: lab results Please inform patient that vaginal swab yielded bacterial vaginitis.  Will start Metronidazole 500 mg BID for 7 days. Refrain from alcohol while taking metronidazole.   Nolon NationsLachina Moore Hollis  MSN, FNP-C [facility] [address]  [place], [place] [address] [phone]

## 2017-06-16 NOTE — Telephone Encounter (Signed)
Called and spoke with patient, advised that swab yielded bacterial vaginitis. Advised that we will start metronidazole 500mg  twice daily for 7 days. Advised to refrain from alcohol. Patient verbalized understanding. Thanks!

## 2017-06-17 ENCOUNTER — Ambulatory Visit: Payer: Medicaid Other | Admitting: Family Medicine

## 2017-07-05 LAB — NUSWAB VAGINITIS PLUS (VG+)
ATOPOBIUM VAGINAE: HIGH {score} — AB
BVAB 2: HIGH {score} — AB
CANDIDA ALBICANS, NAA: NEGATIVE
CANDIDA GLABRATA, NAA: NEGATIVE
Chlamydia trachomatis, NAA: NEGATIVE
MEGASPHAERA 1: HIGH {score} — AB
Neisseria gonorrhoeae, NAA: NEGATIVE
Trich vag by NAA: NEGATIVE

## 2017-07-05 LAB — SPECIMEN STATUS REPORT

## 2017-08-09 ENCOUNTER — Encounter: Payer: Self-pay | Admitting: Family Medicine

## 2017-08-09 ENCOUNTER — Ambulatory Visit (INDEPENDENT_AMBULATORY_CARE_PROVIDER_SITE_OTHER): Payer: Self-pay | Admitting: Family Medicine

## 2017-08-09 VITALS — BP 126/68 | HR 74 | Temp 97.7°F | Ht 65.0 in | Wt 172.0 lb

## 2017-08-09 DIAGNOSIS — N390 Urinary tract infection, site not specified: Secondary | ICD-10-CM

## 2017-08-09 DIAGNOSIS — N898 Other specified noninflammatory disorders of vagina: Secondary | ICD-10-CM

## 2017-08-09 DIAGNOSIS — R319 Hematuria, unspecified: Secondary | ICD-10-CM

## 2017-08-09 DIAGNOSIS — Z09 Encounter for follow-up examination after completed treatment for conditions other than malignant neoplasm: Secondary | ICD-10-CM

## 2017-08-09 LAB — POCT URINALYSIS DIP (MANUAL ENTRY)
Bilirubin, UA: NEGATIVE
Glucose, UA: NEGATIVE mg/dL
Ketones, POC UA: NEGATIVE mg/dL
Nitrite, UA: NEGATIVE
Spec Grav, UA: >=1.03 — AB (ref 1.010–1.025)
Urobilinogen, UA: 0.2 E.U./dL
pH, UA: 5.5 (ref 5.0–8.0)

## 2017-08-09 MED ORDER — NITROFURANTOIN MONOHYD MACRO 100 MG PO CAPS: 100.0000 mg | ORAL_CAPSULE | Freq: Two times a day (BID) | ORAL | 0 refills | Status: DC

## 2017-08-09 MED ORDER — SULFAMETHOXAZOLE-TRIMETHOPRIM 800-160 MG PO TABS: 1.0000 | ORAL_TABLET | Freq: Two times a day (BID) | ORAL | 0 refills | Status: DC

## 2017-08-09 NOTE — Progress Notes (Signed)
Subjective:    Patient ID: Bonnie Stevenson, female    DOB: 10/01/1990, 27 y.o.   MRN: [id number]   PCP: Raliegh Ip, NP  Chief Complaint  Patient presents with  . Vaginal Itching     HPI  [name] has history of Ovarian Cyst, GERD, and Bacterial Vaginosis. She is here today to be evaluated for Vaginal Itching.    Current Status: She states that she has been having itching, burning, increased yellow-white discharge for almost a week. She denies dysuria, cloudy urine, hematuria, and frequency. She denies fevers, chills, fatigue, weight loss, and night sweats. She has blurry vision and will follow up with Optometry. Denies headaches, dizziness, unsteadiness, and falls.   She denies cough, chest pain, heart palpitations, and shortness of breath.  Denies abdominal pain, nausea, vomiting, diarrhea, and constipation.   Denies blood in stools.   She denies anxiety.  Denies pain.    Past Medical History:  Diagnosis Date  . Bacterial vaginosis   . GERD (gastroesophageal reflux disease)   . Ovarian cyst     Family History  Problem Relation Age of Onset  . Hypertension Other   . Cancer Mother     Social History   Socioeconomic History  . Marital status: Single    Spouse name: Not on file  . Number of children: Not on file  . Years of education: Not on file  . Highest education level: Not on file  Occupational History  . Not on file  Social Needs  . Financial resource strain: Not on file  . Food insecurity:    Worry: Not on file    Inability: Not on file  . Transportation needs:    Medical: Not on file    Non-medical: Not on file  Tobacco Use  . Smoking status: Former Smoker    Packs/day: 0.50    Types: Cigarettes  . Smokeless tobacco: Never Used  Substance and Sexual Activity  . Alcohol use: No    Frequency: Never  . Drug use: No  . Sexual activity: Yes    Birth control/protection: None  Lifestyle  . Physical activity:    Days per week:  Not on file    Minutes per session: Not on file  . Stress: Not on file  Relationships  . Social connections:    Talks on phone: Not on file    Gets together: Not on file    Attends religious service: Not on file    Active member of club or organization: Not on file    Attends meetings of clubs or organizations: Not on file    Relationship status: Not on file  . Intimate partner violence:    Fear of current or ex partner: Not on file    Emotionally abused: Not on file    Physically abused: Not on file    Forced sexual activity: Not on file  Other Topics Concern  . Not on file  Social History Narrative  . Not on file    Past Surgical History:  Procedure Laterality Date  . ESOPHAGEAL DILATION      Immunization History  Administered Date(s) Administered  . Tdap 05/06/2017    No outpatient medications have been marked as taking for the 08/09/17 encounter (Office Visit) with Kallie Locks, FNP.   Allergies  Allergen Reactions  . Other Other (See Comments)    "I took some pills for a bladder infection one time and they almost killed me"  Cannot remember  name    BP 126/68 (BP Location: Right Arm, Patient Position: Sitting, Cuff Size: Small)   Pulse 74   Temp 97.7 F (36.5 C) (Oral)   Ht 5\' 5"  (1.651 m)   Wt 172 lb (78 kg)   LMP 07/20/2017   SpO2 100%   BMI 28.62 kg/m   Review of Systems  Constitutional: Negative.   HENT: Negative.   Eyes: Negative.   Respiratory: Negative.   Cardiovascular: Negative.   Gastrointestinal: Negative.   Endocrine: Negative.   Genitourinary: Negative.   Musculoskeletal: Negative.   Skin: Negative.   Allergic/Immunologic: Negative.   Neurological: Negative.   Hematological: Negative.   Psychiatric/Behavioral: Negative.    Objective:   Physical Exam  Constitutional: She is oriented to person, place, and time. She appears well-developed and well-nourished.  HENT:  Head: Normocephalic and atraumatic.  Right Ear: External ear  normal.  Left Ear: External ear normal.  Nose: Nose normal.  Mouth/Throat: Oropharynx is clear and moist.  Eyes: Pupils are equal, round, and reactive to light. Conjunctivae are normal.  Neck: Normal range of motion. Neck supple.  Cardiovascular: Normal rate, regular rhythm, normal heart sounds and intact distal pulses.  Pulmonary/Chest: Effort normal and breath sounds normal.  Abdominal: Soft. Bowel sounds are normal.  Musculoskeletal: Normal range of motion.  Neurological: She is alert and oriented to person, place, and time.  Skin: Skin is warm. Capillary refill takes less than 2 seconds.  Psychiatric: She has a normal mood and affect. Her behavior is normal. Judgment and thought content normal.  Nursing note and vitals reviewed.  Assessment & Plan:   1. Vaginal itching - POCT urinalysis dipstick - NuSwab Vaginitis Plus (VG+) - Vaginitis/Vaginosis, DNA Probe  2. Urinary tract infection with hematuria, site unspecified We will get Urine Culture today.  - Urine Culture - sulfamethoxazole-trimethoprim (BACTRIM DS,SEPTRA DS) 800-160 MG tablet; Take 1 tablet by mouth 2 (two) times daily.  Dispense: 14 tablet; Refill: 0 - Vaginitis/Vaginosis, DNA Probe  3. Follow up She will keep follow up appointment.   Meds ordered this encounter  Medications  . DISCONTD: nitrofurantoin, macrocrystal-monohydrate, (MACROBID) 100 MG capsule    Sig: Take 1 capsule (100 mg total) by mouth 2 (two) times daily.    Dispense:  14 capsule    Refill:  0  . sulfamethoxazole-trimethoprim (BACTRIM DS,SEPTRA DS) 800-160 MG tablet    Sig: Take 1 tablet by mouth 2 (two) times daily.    Dispense:  14 tablet    Refill:  0    Raliegh IpNatalie Medora Roorda,  MSN, FNP-BC [facility] [address]  [place], [place] [address] [phone]

## 2017-08-09 NOTE — Patient Instructions (Signed)
Nitrofurantoin tablets or capsules What is this medicine? NITROFURANTOIN (nye troe fyoor AN toyn) is an antibiotic. It is used to treat urinary tract infections. This medicine may be used for other purposes; ask your health care provider or pharmacist if you have questions. COMMON BRAND NAME(S): Macrobid, Macrodantin, Urotoin What should I tell my health care provider before I take this medicine? They need to know if you have any of these conditions: -anemia -diabetes -glucose-6-phosphate dehydrogenase deficiency -kidney disease -liver disease -lung disease -other chronic illness -an unusual or allergic reaction to nitrofurantoin, other antibiotics, other medicines, foods, dyes or preservatives -pregnant or trying to get pregnant -breast-feeding How should I use this medicine? Take this medicine by mouth with a glass of water. Follow the directions on the prescription label. Take this medicine with food or milk. Take your doses at regular intervals. Do not take your medicine more often than directed. Do not stop taking except on your doctor's advice. Talk to your pediatrician regarding the use of this medicine in children. While this drug may be prescribed for selected conditions, precautions do apply. Overdosage: If you think you have taken too much of this medicine contact a poison control center or emergency room at once. NOTE: This medicine is only for you. Do not share this medicine with others. What if I miss a dose? If you miss a dose, take it as soon as you can. If it is almost time for your next dose, take only that dose. Do not take double or extra doses. What may interact with this medicine? -antacids containing magnesium trisilicate -probenecid -quinolone antibiotics like ciprofloxacin, lomefloxacin, norfloxacin and ofloxacin -sulfinpyrazone This list may not describe all possible interactions. Give your health care provider a list of all the medicines, herbs,  non-prescription drugs, or dietary supplements you use. Also tell them if you smoke, drink alcohol, or use illegal drugs. Some items may interact with your medicine. What should I watch for while using this medicine? Tell your doctor or health care professional if your symptoms do not improve or if you get new symptoms. Drink several glasses of water a day. If you are taking this medicine for a long time, visit your doctor for regular checks on your progress. If you are diabetic, you may get a false positive result for sugar in your urine with certain brands of urine tests. Check with your doctor. What side effects may I notice from receiving this medicine? Side effects that you should report to your doctor or health care professional as soon as possible: -allergic reactions like skin rash or hives, swelling of the face, lips, or tongue -chest pain -cough -difficulty breathing -dizziness, drowsiness -fever or infection -joint aches or pains -pale or blue-tinted skin -redness, blistering, peeling or loosening of the skin, including inside the mouth -tingling, burning, pain, or numbness in hands or feet -unusual bleeding or bruising -unusually weak or tired -yellowing of eyes or skin Side effects that usually do not require medical attention (report to your doctor or health care professional if they continue or are bothersome): -dark urine -diarrhea -headache -loss of appetite -nausea or vomiting -temporary hair loss This list may not describe all possible side effects. Call your doctor for medical advice about side effects. You may report side effects to FDA at 1-800-FDA-1088. Where should I keep my medicine? Keep out of the reach of children. Store at room temperature between 15 and 30 degrees C (59 and 86 degrees F). Protect from light. Throw away any unused   medicine after the expiration date. NOTE: This sheet is a summary. It may not cover all possible information. If you have  questions about this medicine, talk to your doctor, pharmacist, or health care provider.  2018 Elsevier/Gold Standard (2007-09-06 15:56:47)  

## 2017-08-11 LAB — VAGINITIS/VAGINOSIS, DNA PROBE
Candida Species: POSITIVE — AB
Gardnerella vaginalis: POSITIVE — AB
Trichomonas vaginosis: NEGATIVE

## 2017-08-11 LAB — URINE CULTURE: Organism ID, Bacteria: NO GROWTH

## 2017-09-07 ENCOUNTER — Ambulatory Visit: Payer: Medicaid Other | Admitting: Family Medicine

## 2017-09-15 ENCOUNTER — Encounter: Payer: Self-pay | Admitting: Family Medicine

## 2017-09-15 ENCOUNTER — Ambulatory Visit (INDEPENDENT_AMBULATORY_CARE_PROVIDER_SITE_OTHER): Payer: Self-pay | Admitting: Family Medicine

## 2017-09-15 VITALS — BP 113/73 | HR 94 | Temp 99.6°F | Resp 16 | Ht 65.0 in | Wt 172.0 lb

## 2017-09-15 DIAGNOSIS — N898 Other specified noninflammatory disorders of vagina: Secondary | ICD-10-CM

## 2017-09-15 DIAGNOSIS — Z3042 Encounter for surveillance of injectable contraceptive: Secondary | ICD-10-CM

## 2017-09-15 LAB — POCT URINALYSIS DIPSTICK
Bilirubin, UA: NEGATIVE
Blood, UA: NEGATIVE
Glucose, UA: NEGATIVE
Ketones, UA: NEGATIVE
Leukocytes, UA: NEGATIVE
Nitrite, UA: NEGATIVE
Protein, UA: NEGATIVE
Spec Grav, UA: <=1.005 — AB (ref 1.010–1.025)
Urobilinogen, UA: 0.2 E.U./dL
pH, UA: 6 (ref 5.0–8.0)

## 2017-09-15 LAB — POCT URINE PREGNANCY: Preg Test, Ur: NEGATIVE

## 2017-09-15 MED ORDER — MEDROXYPROGESTERONE ACETATE 150 MG/ML IM SUSP
150.0000 mg | Freq: Once | INTRAMUSCULAR | Status: AC
  Administered 2017-09-15: 150 mg via INTRAMUSCULAR

## 2017-09-15 MED ORDER — TINIDAZOLE 500 MG PO TABS: 1.0000 g | ORAL_TABLET | Freq: Every day | ORAL | 0 refills | Status: AC

## 2017-09-15 MED ORDER — FLUCONAZOLE 150 MG PO TABS: 150.0000 mg | ORAL_TABLET | ORAL | 0 refills | Status: DC

## 2017-09-15 NOTE — Progress Notes (Signed)
    Subjective   Bonnie Stevenson 27 y.o. female  [id number]  [id number]  09-Jul-1990    Chief Complaint  Patient presents with  . Vaginal Discharge    vaginal odor   . Contraception    Vaginal Discharge  The patient's primary symptoms include a genital odor and vaginal discharge. The patient's pertinent negatives include no genital itching or pelvic pain. This is a recurrent problem. The current episode started in the past 7 days. The problem occurs daily. The problem has been gradually worsening. The pain is mild. She is not pregnant. Associated symptoms include flank pain.    Review of Systems  Constitutional: Negative.   Genitourinary: Positive for flank pain and vaginal discharge. Negative for pelvic pain.       Vaginal odor   Neurological: Negative.   Psychiatric/Behavioral: Negative.     Objective   Physical Exam  Constitutional: She is oriented to person, place, and time. She appears well-developed and well-nourished. No distress.  Cardiovascular: Normal rate, regular rhythm and intact distal pulses.  No murmur heard. Pulmonary/Chest: Effort normal and breath sounds normal.  Abdominal: Soft. Bowel sounds are normal. There is no tenderness.  Genitourinary:  Genitourinary Comments: Pt declined exam.   Neurological: She is alert and oriented to person, place, and time.  Psychiatric: She has a normal mood and affect. Her behavior is normal. Judgment and thought content normal.  Nursing note and vitals reviewed.   BP 113/73 (BP Location: Left Arm, Patient Position: Sitting, Cuff Size: Large)   Pulse 94   Temp 99.6 F (37.6 C) (Oral)   Resp 16   Ht 5\' 5"  (1.651 m)   Wt 172 lb (78 kg)   SpO2 100%   BMI 28.62 kg/m   Assessment   Encounter Diagnoses  Name Primary?  . Vaginal itching Yes  . Encounter for surveillance of injectable contraceptive      Plan  1. Vaginal itching - Vaginitis/Vaginosis, DNA Probe - tinidazole (TINDAMAX) 500 MG tablet;  Take 2 tablets (1,000 mg total) by mouth daily with breakfast for 5 days.  Dispense: 10 tablet; Refill: 0 - fluconazole (DIFLUCAN) 150 MG tablet; Take 1 tablet (150 mg total) by mouth 2 (two) times a week. Take one dose now and repeat in 72 hours.  Dispense: 2 tablet; Refill: 0 - Urinalysis Dipstick  2. Encounter for surveillance of injectable contraceptive - POCT urine pregnancy - medroxyPROGESTERone (DEPO-PROVERA) injection 150 mg   This note has been created with Education officer, environmentalDragon speech recognition software and smart phrase technology. Any transcriptional errors are unintentional.

## 2017-09-17 LAB — VAGINITIS/VAGINOSIS, DNA PROBE
Candida Species: NEGATIVE
Gardnerella vaginalis: POSITIVE — AB
Trichomonas vaginosis: NEGATIVE

## 2017-09-26 ENCOUNTER — Ambulatory Visit: Payer: Medicaid Other | Admitting: Family Medicine

## 2017-11-02 ENCOUNTER — Ambulatory Visit: Payer: Medicaid Other | Admitting: Family Medicine

## 2017-11-07 ENCOUNTER — Ambulatory Visit: Payer: Medicaid Other | Admitting: Family Medicine

## 2017-11-14 ENCOUNTER — Telehealth: Payer: Self-pay

## 2017-11-14 NOTE — Telephone Encounter (Signed)
Patient states she was positive for BV last time she was here and was unable to afford the tablets at that time. She wants to know if you can prescribe her metronidazole for this now? Please advise. Thanks!

## 2017-11-14 NOTE — Telephone Encounter (Signed)
Patient will need to be seen for vaginal examination and testing. It was 2 months ago.

## 2017-11-15 NOTE — Telephone Encounter (Signed)
Called and spoke with patient, advised that she will need to be seen for this since it was two months ago. Thanks!

## 2017-11-16 ENCOUNTER — Ambulatory Visit (INDEPENDENT_AMBULATORY_CARE_PROVIDER_SITE_OTHER): Payer: Self-pay | Admitting: Family Medicine

## 2017-11-16 VITALS — BP 124/73 | HR 95 | Temp 98.1°F | Resp 16 | Ht 65.0 in | Wt 176.0 lb

## 2017-11-16 DIAGNOSIS — N898 Other specified noninflammatory disorders of vagina: Secondary | ICD-10-CM

## 2017-11-16 MED ORDER — METRONIDAZOLE 500 MG PO TABS: 500.0000 mg | ORAL_TABLET | Freq: Two times a day (BID) | ORAL | 0 refills | Status: AC

## 2017-11-16 MED ORDER — FLUCONAZOLE 150 MG PO TABS: 150.0000 mg | ORAL_TABLET | Freq: Once | ORAL | 0 refills | Status: AC

## 2017-11-16 NOTE — Progress Notes (Signed)
  [facility]   Progress Note: General Provider: Mike GipAndre Caulin Begley, FNP  SUBJECTIVE:   Bonnie Stevenson is a 27 y.o. female who  has a past medical history of Bacterial vaginosis, GERD (gastroesophageal reflux disease), and Ovarian cyst.. Patient presents today for Vaginitis (ordor ) and Ear Drainage Patient states that she was unable to get the tindamax due to cost. Would like to try flagyl again.  Continues to have vaginal discharge and itching. She declines vaginal exam today.  Review of Systems  Genitourinary:       Vaginal discharge.   All other systems reviewed and are negative.    OBJECTIVE: BP 124/73 (BP Location: Left Arm, Patient Position: Sitting, Cuff Size: Normal)   Pulse 95   Temp 98.1 F (36.7 C) (Oral)   Resp 16   Ht 5\' 5"  (1.651 m)   Wt 176 lb (79.8 kg)   LMP 11/02/2017   SpO2 100%   BMI 29.29 kg/m   Physical Exam  Constitutional: She is oriented to person, place, and time. She appears well-developed and well-nourished. No distress.  HENT:  Head: Normocephalic and atraumatic.  Eyes: Pupils are equal, round, and reactive to light. Conjunctivae and EOM are normal.  Neck: Normal range of motion.  Cardiovascular: Normal rate, regular rhythm, normal heart sounds and intact distal pulses.  Pulmonary/Chest: Effort normal and breath sounds normal. No respiratory distress.  Genitourinary:  Genitourinary Comments: Patient declined vaginal exam.   Musculoskeletal: Normal range of motion.  Neurological: She is alert and oriented to person, place, and time.  Skin: Skin is warm and dry.  Psychiatric: She has a normal mood and affect. Her behavior is normal. Thought content normal.  Nursing note and vitals reviewed.   ASSESSMENT/PLAN:  1. Vaginal itching - Vaginitis/Vaginosis, DNA Probe - metroNIDAZOLE (FLAGYL) 500 MG tablet; Take 1 tablet (500 mg total) by mouth 2 (two) times daily for 7 days.  Dispense: 14  tablet; Refill: 0 - fluconazole (DIFLUCAN) 150 MG tablet; Take 1 tablet (150 mg total) by mouth once for 1 dose.  Dispense: 1 tablet; Refill: 0    We discussed reasons for recurring BV. Patient will need to have vaginal exam before next treatment of vaginitis.     The patient was given clear instructions to go to ER or return to medical center if symptoms do not improve, worsen or new problems develop. The patient verbalized understanding and agreed with plan of care.   [name], FNP-BC Patient [facility] [address]  [place], [place] [address] [phone]     This note has been created with Dragon speech recognition software and smart phrase technology. Any transcriptional errors are unintentional.

## 2017-11-16 NOTE — Patient Instructions (Signed)

## 2017-11-18 ENCOUNTER — Encounter: Payer: Self-pay | Admitting: Family Medicine

## 2017-11-18 LAB — VAGINITIS/VAGINOSIS, DNA PROBE
Candida Species: NEGATIVE
Gardnerella vaginalis: POSITIVE — AB
Trichomonas vaginosis: NEGATIVE

## 2017-12-15 ENCOUNTER — Ambulatory Visit (INDEPENDENT_AMBULATORY_CARE_PROVIDER_SITE_OTHER): Payer: Self-pay | Admitting: Family Medicine

## 2017-12-15 ENCOUNTER — Encounter: Payer: Self-pay | Admitting: Family Medicine

## 2017-12-15 VITALS — BP 122/74 | HR 90 | Temp 97.9°F | Ht 65.0 in | Wt 176.0 lb

## 2017-12-15 DIAGNOSIS — Z09 Encounter for follow-up examination after completed treatment for conditions other than malignant neoplasm: Secondary | ICD-10-CM

## 2017-12-15 DIAGNOSIS — N83209 Unspecified ovarian cyst, unspecified side: Secondary | ICD-10-CM

## 2017-12-15 DIAGNOSIS — Z3042 Encounter for surveillance of injectable contraceptive: Secondary | ICD-10-CM

## 2017-12-15 DIAGNOSIS — N939 Abnormal uterine and vaginal bleeding, unspecified: Secondary | ICD-10-CM

## 2017-12-15 LAB — POCT URINALYSIS DIP (MANUAL ENTRY)
Bilirubin, UA: NEGATIVE
Glucose, UA: NEGATIVE mg/dL
Ketones, POC UA: NEGATIVE mg/dL
Leukocytes, UA: NEGATIVE
Nitrite, UA: NEGATIVE
Protein Ur, POC: NEGATIVE mg/dL
Spec Grav, UA: 1.01 (ref 1.010–1.025)
Urobilinogen, UA: 0.2 E.U./dL
pH, UA: 7 (ref 5.0–8.0)

## 2017-12-15 LAB — POCT URINE PREGNANCY: Preg Test, Ur: NEGATIVE

## 2017-12-15 MED ORDER — MEDROXYPROGESTERONE ACETATE 150 MG/ML IM SUSY
150.0000 mg | PREFILLED_SYRINGE | Freq: Once | INTRAMUSCULAR | Status: AC
  Administered 2017-12-15: 150 mg via INTRAMUSCULAR

## 2017-12-15 NOTE — Progress Notes (Signed)
  [facility]   Progress Note: General Provider: Mike Gip, FNP  SUBJECTIVE:   Bonnie Stevenson is a 27 y.o. female who  has a past medical history of Bacterial vaginosis, GERD (gastroesophageal reflux disease), and Ovarian cyst.. Patient presents today for Follow-up [facility] ) Patient presents for depo provera and to discuss her ultrasound of 04/2017. Patient states that she continues to have heavy bleeding with blood clots. States that she has vaginal bleeding that lasts 2 months at a time. Would like referral to Gyn.  Review of Systems  Constitutional: Negative.   HENT: Negative.   Eyes: Negative.   Respiratory: Negative.   Cardiovascular: Negative.   Gastrointestinal: Negative.   Genitourinary: Negative.        Uterine bleeding.   Musculoskeletal: Negative.   Skin: Negative.   Neurological: Negative.   Psychiatric/Behavioral: Negative.      OBJECTIVE: BP 122/74 (BP Location: Left Arm, Patient Position: Sitting, Cuff Size: Large)   Pulse 90   Temp 97.9 F (36.6 C) (Oral)   Ht 5\' 5"  (1.651 m)   Wt 176 lb (79.8 kg)   LMP 11/02/2017   SpO2 96%   BMI 29.29 kg/m   Physical Exam  Constitutional: She is oriented to person, place, and time. She appears well-developed and well-nourished. No distress.  HENT:  Head: Normocephalic and atraumatic.  Eyes: Pupils are equal, round, and reactive to light. Conjunctivae and EOM are normal.  Neck: Normal range of motion.  Cardiovascular: Normal rate, regular rhythm, normal heart sounds and intact distal pulses.  Pulmonary/Chest: Effort normal and breath sounds normal. No respiratory distress.  Abdominal: Soft. Bowel sounds are normal. She exhibits no distension.  Musculoskeletal: Normal range of motion.  Neurological: She is alert and oriented to person, place, and time.  Skin: Skin is warm and dry.  Psychiatric: She has a normal mood and affect. Her behavior is  normal. Thought content normal.  Nursing note and vitals reviewed.   ASSESSMENT/PLAN:  1. Follow up - POCT urinalysis dipstick  2. Cyst of ovary, unspecified laterality - Ambulatory referral to Gynecology  3. Abnormal uterine and vaginal bleeding, unspecified - Ambulatory referral to Gynecology  4. Encounter for surveillance of injectable contraceptive Safe sex advised - medroxyPROGESTERone Acetate SUSY 150 mg - POCT urine pregnancy         The patient was given clear instructions to go to ER or return to medical center if symptoms do not improve, worsen or new problems develop. The patient verbalized understanding and agreed with plan of care.   [name]. [name], FNP-BC Patient Care [facility] [address] [place], [place] [address] [phone]     This note has been created with Dragon speech recognition software and smart phrase technology. Any transcriptional errors are unintentional.

## 2017-12-15 NOTE — Patient Instructions (Signed)
Ovarian Cyst An ovarian cyst is a fluid-filled sac on an ovary. The ovaries are organs that make eggs in women. Most ovarian cysts go away on their own and are not cancerous (are benign). Some cysts need treatment. Follow these instructions at home:  Take over-the-counter and prescription medicines only as told by your doctor.  Do not drive or use heavy machinery while taking prescription pain medicine.  Get pelvic exams and Pap tests as often as told by your doctor.  Return to your normal activities as told by your doctor. Ask your doctor what activities are safe for you.  Do not use any products that contain nicotine or tobacco, such as cigarettes and e-cigarettes. If you need help quitting, ask your doctor.  Keep all follow-up visits as told by your doctor. This is important. Contact a doctor if:  Your periods are: ? Late. ? Irregular. ? Painful.  Your periods stop.  You have pelvic pain that does not go away.  You have pressure on your bladder.  You have trouble making your bladder empty when you pee (urinate).  You have pain during sex.  You have any of the following in your belly (abdomen): ? A feeling of fullness. ? Pressure. ? Discomfort. ? Pain that does not go away. ? Swelling.  You feel sick most of the time.  You have trouble pooping (have constipation).  You are not as hungry as usual (you lose your appetite).  You get very bad acne.  You start to have more hair on your body and face.  You are gaining weight or losing weight without changing your exercise and eating habits.  You think you may be pregnant. Get help right away if:  You have belly pain that is very bad or gets worse.  You cannot eat or drink without throwing up (vomiting).  You suddenly get a fever.  Your period is a lot heavier than usual. This information is not intended to replace advice given to you by your health care provider. Make sure you discuss any questions you have  with your health care provider. Document Released: 08/04/2007 Document Revised: 09/05/2015 Document Reviewed: 07/20/2015 Elsevier Interactive Patient Education  2018 Elsevier Inc.  

## 2018-02-27 ENCOUNTER — Encounter: Payer: Medicaid Other | Admitting: Family Medicine

## 2018-03-17 ENCOUNTER — Ambulatory Visit (INDEPENDENT_AMBULATORY_CARE_PROVIDER_SITE_OTHER): Payer: BLUE CROSS/BLUE SHIELD | Admitting: Family Medicine

## 2018-03-17 VITALS — BP 124/72 | HR 77 | Temp 98.0°F | Resp 14 | Ht 65.0 in | Wt 178.0 lb

## 2018-03-17 DIAGNOSIS — Z3041 Encounter for surveillance of contraceptive pills: Secondary | ICD-10-CM | POA: Diagnosis not present

## 2018-03-17 DIAGNOSIS — N898 Other specified noninflammatory disorders of vagina: Secondary | ICD-10-CM | POA: Diagnosis not present

## 2018-03-17 DIAGNOSIS — Z3202 Encounter for pregnancy test, result negative: Secondary | ICD-10-CM | POA: Diagnosis not present

## 2018-03-17 LAB — POCT URINE PREGNANCY: Preg Test, Ur: NEGATIVE

## 2018-03-17 MED ORDER — FLUCONAZOLE 150 MG PO TABS: 150.0000 mg | ORAL_TABLET | Freq: Once | ORAL | 0 refills | Status: AC

## 2018-03-17 MED ORDER — NORGESTIMATE-ETH ESTRADIOL 0.25-35 MG-MCG PO TABS: 1.0000 | ORAL_TABLET | Freq: Every day | ORAL | 3 refills | Status: DC

## 2018-03-17 MED ORDER — METRONIDAZOLE 0.75 % VA GEL: 1.0000 | Freq: Every day | VAGINAL | 0 refills | Status: AC

## 2018-03-17 NOTE — Patient Instructions (Signed)
Vaginitis  Vaginitis is irritation and swelling (inflammation) of the vagina. It happens when normal bacteria and yeast in the vagina grow too much. There are many types of this condition. Treatment will depend on the type you have. Follow these instructions at home: Lifestyle  Keep your vagina area clean and dry. ? Avoid using soap. ? Rinse the area with water.  Do not do the following until your doctor says it is okay: ? Wash and clean out the vagina (douche). ? Use tampons. ? Have sex.  Wipe from front to back after going to the bathroom.  Let air reach your vagina. ? Wear cotton underwear. ? Do not wear: ? Underwear while you sleep. ? Tight pants. ? Thong underwear. ? Underwear or nylons without a cotton panel. ? Take off any wet clothing, such as bathing suits, as soon as possible.  Use gentle, non-scented products. Do not use things that can irritate the vagina, such as fabric softeners. Avoid the following products if they are scented: ? Feminine sprays. ? Detergents. ? Tampons. ? Feminine hygiene products. ? Soaps or bubble baths.  Practice safe sex and use condoms. General instructions  Take over-the-counter and prescription medicines only as told by your doctor.  If you were prescribed an antibiotic medicine, take or use it as told by your doctor. Do not stop taking or using the antibiotic even if you start to feel better.  Keep all follow-up visits as told by your doctor. This is important. Contact a doctor if:  You have pain in your belly.  You have a fever.  Your symptoms last for more than 2-3 days. Get help right away if:  You have a fever and your symptoms get worse all of a sudden. Summary  Vaginitis is irritation and swelling of the vagina. It can happen when the normal bacteria and yeast in the vagina grow too much. There are many types.  Treatment will depend on the type you have.  Do not douche, use tampons , or have sex until your health  care provider approves. When you can return to sex, practice safe sex and use condoms. This information is not intended to replace advice given to you by your health care provider. Make sure you discuss any questions you have with your health care provider. Document Released: 05/14/2008 Document Revised: 03/09/2016 Document Reviewed: 03/09/2016 Elsevier Interactive Patient Education  2019 [employer] Oral Contraception Information Oral contraceptive pills (OCPs) are medicines taken to prevent pregnancy. OCPs are taken by mouth, and they work by:  Preventing the ovaries from releasing eggs.  Thickening mucus in the lower part of the uterus (cervix), which prevents sperm from entering the uterus.  Thinning the lining of the uterus (endometrium), which prevents a fertilized egg from attaching to the endometrium. OCPs are highly effective when taken exactly as prescribed. However, OCPs do not prevent STIs (sexually transmitted infections). Safe sex practices, such as using condoms while on an OCP, can help prevent STIs. Before starting OCPs Before you start taking OCPs, you may have a physical exam, blood test, and Pap test. However, you are not required to have a pelvic exam in order to be prescribed OCPs. Your health care provider will make sure you are a good candidate for oral contraception. OCPs are not a good option for certain women, including women who smoke and are older than 35 years, and women with a medical history of high blood pressure, deep vein thrombosis, pulmonary embolism, stroke, cardiovascular disease, or peripheral  vascular disease. Discuss with your health care provider the possible side effects of the OCP you may be prescribed. When you start an OCP, be aware that it can take 2-3 months for your body to adjust to changes in hormone levels. Follow instructions from your health care provider about how to start taking your first cycle of OCPs. Depending on when you start the pill,  you may need to use a backup form of birth control, such as condoms, during the first week. Make sure you know what steps to take if you ever forget to take the pill. Types of oral contraception  The most common types of birth control pills contain the hormones estrogen and progestin (synthetic progesterone) or progestin only. The combination pill This type of pill contains estrogen and progestin hormones. Combination pills often come in packs of 21, 28, or 91 pills. For each pack, the last 7 pills may not contain hormones, which means you may stop taking the pills for 7 days. Menstrual bleeding occurs during the week that you do not take the pills or that you take the pills with no hormones in them. The minipill This type of pill contains the progestin hormone only. It comes in packs of 28 pills. All 28 pills contain the hormone. You take the pill every day. It is very important to take the pill at the same time each day. Advantages of oral contraceptive pills  Provides reliable and continuous contraception if taken as instructed.  May treat or decrease symptoms of: ? Menstrual period cramps. ? Irregular menstrual cycle or bleeding. ? Heavy menstrual flow. ? Abnormal uterine bleeding. ? Acne, depending on the type of pill. ? Polycystic ovarian syndrome. ? Endometriosis. ? Iron deficiency anemia. ? Premenstrual symptoms, including premenstrual dysphoric disorder.  May reduce the risk of endometrial and ovarian cancer.  Can be used as emergency contraception.  Prevents mislocated (ectopic) pregnancies and infections of the fallopian tubes. Things that can make oral contraceptive pills less effective OCPs can be less effective if:  You forget to take the pill at the same time every day. This is especially important when taking the minipill.  You have a stomach or intestinal disease that reduces your body's ability to absorb the pill.  You take OCPs with other medicines that make OCPs  less effective, such as antibiotics, certain HIV medicines, and some seizure medicines.  You take expired OCPs.  You forget to restart the pill on day 7, if using the packs of 21 pills. Risks associated with oral contraceptive pills Oral contraceptive pills can sometimes cause side effects, such as:  Headache.  Depression.  Trouble sleeping.  Nausea and vomiting.  Breast tenderness.  Irregular bleeding or spotting during the first several months.  Bloating or fluid retention.  Increase in blood pressure. Combination pills are also associated with a small increase in the risk of:  Blood clots.  Heart attack.  Stroke. Summary  Oral contraceptive pills are medicines taken by mouth to prevent pregnancy. They are highly effective when taken exactly as prescribed.  The most common types of birth control pills contain the hormones estrogen and progestin (synthetic progesterone) or progestin only.  Before you start taking the pill, you may have a physical exam, blood test, and Pap test. Your health care provider will make sure you are a good candidate for oral contraception.  The combination pill may come in a 21-day pack, a 28-day pack, or a 91-day pack. The minipill contains the progesterone hormone only  and comes in packs of 28 pills.  Oral contraceptive pills can sometimes cause side effects, such as headache, nausea, breast tenderness, or irregular bleeding. This information is not intended to replace advice given to you by your health care provider. Make sure you discuss any questions you have with your health care provider. Document Released: 05/08/2002 Document Revised: 05/11/2016 Document Reviewed: 05/11/2016 Elsevier Interactive Patient Education  2019 [employer]

## 2018-03-17 NOTE — Progress Notes (Signed)
  [facility]   Progress Note: General Provider: Mike Gip, FNP  SUBJECTIVE:   Bonnie Stevenson is a 28 y.o. female who  has a past medical history of Bacterial vaginosis, GERD (gastroesophageal reflux disease), and Ovarian cyst.. Patient presents today for Vaginal Itching; Vaginal Discharge (white discharge ); and Contraception (wants to switch from depo )   Patient states that she has vaginal discharge and itching x 1 week after switching soaps.  Review of Systems  Genitourinary:       Vaginal irritation  All other systems reviewed and are negative.    OBJECTIVE: BP 124/72 (BP Location: Left Arm, Patient Position: Sitting, Cuff Size: Large)   Pulse 77   Temp 98 F (36.7 C) (Oral)   Resp 14   Ht 5\' 5"  (1.651 m)   Wt 178 lb (80.7 kg)   SpO2 100%   BMI 29.62 kg/m   Wt Readings from Last 3 Encounters:  03/17/18 178 lb (80.7 kg)  12/15/17 176 lb (79.8 kg)  11/16/17 176 lb (79.8 kg)     Physical Exam Constitutional:      General: She is not in acute distress.    Appearance: Normal appearance.  HENT:     Head: Normocephalic and atraumatic.  Genitourinary:    Comments: Self collected swab.  Neurological:     Mental Status: She is alert.     ASSESSMENT/PLAN:   1. Vaginal itching - Vaginitis/Vaginosis, DNA Probe - metroNIDAZOLE (METROGEL VAGINAL) 0.75 % vaginal gel; Place 1 Applicatorful vaginally at bedtime for 5 days.  Dispense: 70 g; Refill: 0 - fluconazole (DIFLUCAN) 150 MG tablet; Take 1 tablet (150 mg total) by mouth once for 1 dose. Take one tablet now and repeat in 3 days if symptoms persist.  Dispense: 1 tablet; Refill: 0  2. Encounter for surveillance of contraceptive pills - norgestimate-ethinyl estradiol (SPRINTEC 28) 0.25-35 MG-MCG tablet; Take 1 tablet by mouth daily.  Dispense: 3 Package; Refill: 3 - POCT urine pregnancy   Patient referred to [facility] for further contraception management. Would  like IUD. Started on OCPs to help with uncontrolled bleeding due to depo provera. Patient is due for shot today. Negative pregnancy.     Return if symptoms worsen or fail to improve.    The patient was given clear instructions to go to ER or return to medical center if symptoms do not improve, worsen or new problems develop. The patient verbalized understanding and agreed with plan of care.   [name]. [name], FNP-BC [facility] [address] [place], [place] [address] [phone]

## 2018-03-19 LAB — VAGINITIS/VAGINOSIS, DNA PROBE
Candida Species: NEGATIVE
Gardnerella vaginalis: NEGATIVE
Trichomonas vaginosis: NEGATIVE

## 2018-04-10 ENCOUNTER — Ambulatory Visit (INDEPENDENT_AMBULATORY_CARE_PROVIDER_SITE_OTHER): Payer: BLUE CROSS/BLUE SHIELD | Admitting: Family Medicine

## 2018-04-10 ENCOUNTER — Encounter: Payer: Self-pay | Admitting: Family Medicine

## 2018-04-10 VITALS — BP 122/68 | HR 87 | Temp 98.7°F | Resp 16 | Ht 65.0 in | Wt 178.0 lb

## 2018-04-10 DIAGNOSIS — B9789 Other viral agents as the cause of diseases classified elsewhere: Secondary | ICD-10-CM | POA: Diagnosis not present

## 2018-04-10 DIAGNOSIS — J029 Acute pharyngitis, unspecified: Secondary | ICD-10-CM | POA: Diagnosis not present

## 2018-04-10 DIAGNOSIS — J069 Acute upper respiratory infection, unspecified: Secondary | ICD-10-CM

## 2018-04-10 LAB — POCT RAPID STREP A (OFFICE): Rapid Strep A Screen: NEGATIVE

## 2018-04-10 LAB — INFLUENZA PANEL BY PCR (TYPE A & B)
Influenza A By PCR: NEGATIVE
Influenza B By PCR: NEGATIVE

## 2018-04-10 MED ORDER — PROMETHAZINE-DM 6.25-15 MG/5ML PO SYRP: 5.0000 mL | ORAL_SOLUTION | Freq: Four times a day (QID) | ORAL | 0 refills | Status: DC | PRN

## 2018-04-10 NOTE — Progress Notes (Signed)
[facility]   Progress Note: Sick Visit Provider: Mike GipAndre Jaysten Essner, FNP  SUBJECTIVE:   Bonnie Stevenson is a 28 y.o. female who  has a past medical history of Bacterial vaginosis, GERD (gastroesophageal reflux disease), and Ovarian cyst.. Patient presents today for Sore Throat and Sinus Problem  Patient with headache, sore throat, cough and chills x 1 day.  Took otc medication last night with slight relief. Patient with several sick contacts in her home.   Review of Systems  Constitutional: Positive for chills.  HENT: Positive for congestion.   Eyes: Negative.   Respiratory: Positive for cough. Negative for sputum production and shortness of breath.   Cardiovascular: Negative.   Gastrointestinal: Negative.   Genitourinary: Negative.   Musculoskeletal: Positive for myalgias.  Skin: Negative.   Neurological: Negative.   Psychiatric/Behavioral: Negative.      OBJECTIVE: BP 122/68 (BP Location: Right Arm, Patient Position: Sitting, Cuff Size: Normal)   Pulse 87   Temp 98.7 F (37.1 C) (Oral)   Resp 16   Ht 5\' 5"  (1.651 m)   Wt 178 lb (80.7 kg)   LMP 03/10/2018 Comment: on depo  SpO2 100%   BMI 29.62 kg/m   Wt Readings from Last 3 Encounters:  04/10/18 178 lb (80.7 kg)  03/17/18 178 lb (80.7 kg)  12/15/17 176 lb (79.8 kg)     Physical Exam Vitals signs and nursing note reviewed.  Constitutional:      General: She is not in acute distress.    Appearance: She is well-developed.  HENT:     Head: Normocephalic and atraumatic.     Right Ear: Tympanic membrane normal.     Left Ear: Tympanic membrane normal.     Mouth/Throat:     Mouth: Mucous membranes are moist.     Pharynx: Oropharyngeal exudate (PND) present. No pharyngeal swelling or posterior oropharyngeal erythema.  Eyes:     Conjunctiva/sclera: Conjunctivae normal.     Pupils: Pupils are equal, round, and reactive to light.  Neck:     Musculoskeletal:  Normal range of motion.  Cardiovascular:     Rate and Rhythm: Normal rate and regular rhythm.     Heart sounds: Normal heart sounds.  Pulmonary:     Effort: Pulmonary effort is normal. No respiratory distress.     Breath sounds: Normal breath sounds.  Abdominal:     General: Bowel sounds are normal. There is no distension.     Palpations: Abdomen is soft.  Musculoskeletal: Normal range of motion.  Skin:    General: Skin is warm and dry.  Neurological:     Mental Status: She is alert and oriented to person, place, and time.  Psychiatric:        Mood and Affect: Mood normal.        Behavior: Behavior normal.        Thought Content: Thought content normal.     ASSESSMENT/PLAN:  1. Viral URI with cough Comfort measures to help with symptoms of Viral URI.  Increase fluids and rest. Tylenol or Motrin for fever, chills, and body aches (if not contraindicated). Nasal irrigation, humidifier, lozenges, vapor rub, warm fluids (soups, herbal teas with honey and lemon) and things that make you feel better.   - Rapid Strep A - Influenza panel by PCR (type A & B) - promethazine-dextromethorphan (PROMETHAZINE-DM) 6.25-15 MG/5ML syrup; Take 5 mLs by mouth 4 (four) times daily as needed for cough.  Dispense: 118 mL; Refill:  0           The patient was given clear instructions to go to ER or return to medical center if symptoms do not improve, worsen or new problems develop. The patient verbalized understanding and agreed with plan of care.   Ms. Bonnie Stevenson. Bonnie Lam, FNP-BC [facility] [address] [place], [place] [address] [phone]     This note has been created with Dragon speech recognition software and smart phrase technology. Any transcriptional errors are unintentional.

## 2018-04-10 NOTE — Patient Instructions (Addendum)
Comfort measures to help with symptoms of Viral URI.  Increase fluids and rest. Tylenol or Motrin for fever, chills, and body aches (if not contraindicated). Nasal irrigation, humidifier, lozenges, vapor rub, warm fluids (soups, herbal teas with honey and lemon) and things that make you feel better.      Viral Respiratory Infection A viral respiratory infection is an illness that affects parts of the body that are used for breathing. These include the lungs, nose, and throat. It is caused by a germ called a virus. Some examples of this kind of infection are:  A cold.  The flu (influenza).  A respiratory syncytial virus (RSV) infection. A person who gets this illness may have the following symptoms:  A stuffy or runny nose.  Yellow or green fluid in the nose.  A cough.  Sneezing.  Tiredness (fatigue).  Achy muscles.  A sore throat.  Sweating or chills.  A fever.  A headache. Follow these instructions at home: Managing pain and congestion  Take over-the-counter and prescription medicines only as told by your doctor.  If you have a sore throat, gargle with salt water. Do this 3-4 times per day or as needed. To make a salt-water mixture, dissolve -1 tsp of salt in 1 cup of warm water. Make sure that all the salt dissolves.  Use nose drops made from salt water. This helps with stuffiness (congestion). It also helps soften the skin around your nose.  Drink enough fluid to keep your pee (urine) pale yellow. General instructions   Rest as much as possible.  Do not drink alcohol.  Do not use any products that have nicotine or tobacco, such as cigarettes and e-cigarettes. If you need help quitting, ask your doctor.  Keep all follow-up visits as told by your doctor. This is important. How is this prevented?   Get a flu shot every year. Ask your doctor when you should get your flu shot.  Do not let other people get your germs. If you are sick: ? Stay home from work  or school. ? Wash your hands with soap and water often. Wash your hands after you cough or sneeze. If soap and water are not available, use hand sanitizer.  Avoid contact with people who are sick during cold and flu season. This is in fall and winter. Get help if:  Your symptoms last for 10 days or longer.  Your symptoms get worse over time.  You have a fever.  You have very bad pain in your face or forehead.  Parts of your jaw or neck become very swollen. Get help right away if:  You feel pain or pressure in your chest.  You have shortness of breath.  You faint or feel like you will faint.  You keep throwing up (vomiting).  You feel confused. Summary  A viral respiratory infection is an illness that affects parts of the body that are used for breathing.  Examples of this illness include a cold, the flu, and respiratory syncytial virus (RSV) infection.  The infection can cause a runny nose, cough, sneezing, sore throat, and fever.  Follow what your doctor tells you about taking medicines, drinking lots of fluid, washing your hands, resting at home, and avoiding people who are sick. This information is not intended to replace advice given to you by your health care provider. Make sure you discuss any questions you have with your health care provider. Document Released: 01/29/2008 Document Revised: 03/28/2017 Document Reviewed: 03/28/2017 Elsevier Interactive Patient  Education  2019 [employer].

## 2018-05-10 ENCOUNTER — Other Ambulatory Visit: Payer: Self-pay

## 2018-05-10 ENCOUNTER — Ambulatory Visit: Payer: Self-pay | Admitting: Obstetrics and Gynecology

## 2018-05-10 ENCOUNTER — Other Ambulatory Visit (HOSPITAL_COMMUNITY)
Admission: RE | Admit: 2018-05-10 | Discharge: 2018-05-10 | Disposition: A | Discharge status: Home / Self Care | Payer: BLUE CROSS/BLUE SHIELD | Source: Ambulatory Visit | Attending: Obstetrics and Gynecology | Admitting: Obstetrics and Gynecology

## 2018-05-10 ENCOUNTER — Encounter: Payer: Self-pay | Admitting: Obstetrics and Gynecology

## 2018-05-10 VITALS — BP 130/78 | HR 90 | Wt 173.3 lb

## 2018-05-10 DIAGNOSIS — Z8742 Personal history of other diseases of the female genital tract: Secondary | ICD-10-CM

## 2018-05-10 DIAGNOSIS — N939 Abnormal uterine and vaginal bleeding, unspecified: Secondary | ICD-10-CM

## 2018-05-10 DIAGNOSIS — N898 Other specified noninflammatory disorders of vagina: Secondary | ICD-10-CM | POA: Insufficient documentation

## 2018-05-10 LAB — POCT PREGNANCY, URINE: PREG TEST UR: NEGATIVE

## 2018-05-10 MED ORDER — LEVONORGEST-ETH EST & ETH EST 42-21-21-7 DAYS PO TABS: 1.0000 | ORAL_TABLET | Freq: Every day | ORAL | 3 refills | Status: DC

## 2018-05-10 NOTE — Progress Notes (Signed)
Obstetrics and Gynecology New Patient Evaluation  Appointment Date: 05/10/2018  OBGYN Clinic: [facility]  Primary Care Provider: Mike Gip  Referring Provider: Mike Gip, FNP  Chief Complaint: NPE for aub  History of Present Illness: Bonnie Stevenson is a 28 y.o. African-American G0 (No LMP recorded.), seen for the above chief complaint. Her past medical history is significant for GERD.  Patient with longstanding h/o AUB.  Patient states she got depo shot in 2019 x 3 with last inj in 11/2017 and she didn't like that b/c it caused weight gain. She was prescribed OCPs in January of 2020 but hasn't started them yet. LMP was in January and was about a week. Prior to being on depo, her periods were irregular happening every few months and not particularly heavy or painful.   Pt also with recurrent GERD s/s on PPI from PCP  Pt also with asymptomatic hemorrhoid   No vaginal itching, discharge, abdominal pain, dysuria, nausea, vomiting, diarrhea, constipation.   Review of Systems: as noted in the History of Present Illness.  Past Medical History:  Past Medical History:  Diagnosis Date  . Bacterial vaginosis   . GERD (gastroesophageal reflux disease)   . Ovarian cyst     Past Surgical History:  Past Surgical History:  Procedure Laterality Date  . ESOPHAGEAL DILATION      Past Obstetrical History:  OB History  Gravida Para Term Preterm AB Living  0 0 0 0 0 0  SAB TAB Ectopic Multiple Live Births  0 0 0 0 0   Past Gynecological History: As per HPI. History of Pap Smear(s): Yes.   Last pap 2019, which was negative History of STI(s): No. Last testing 2019  Social History:  Social History   Socioeconomic History  . Marital status: Single    Spouse name: Not on file  . Number of children: Not on file  . Years of education: Not on file  . Highest education level: Not on file  Occupational History  . Not on file  Social Needs  .  Financial resource strain: Not on file  . Food insecurity:    Worry: Not on file    Inability: Not on file  . Transportation needs:    Medical: Not on file    Non-medical: Not on file  Tobacco Use  . Smoking status: Former Smoker    Packs/day: 0.50    Types: Cigarettes  . Smokeless tobacco: Never Used  Substance and Sexual Activity  . Alcohol use: No    Frequency: Never  . Drug use: No  . Sexual activity: Yes    Birth control/protection: None  Lifestyle  . Physical activity:    Days per week: Not on file    Minutes per session: Not on file  . Stress: Not on file  Relationships  . Social connections:    Talks on phone: Not on file    Gets together: Not on file    Attends religious service: Not on file    Active member of club or organization: Not on file    Attends meetings of clubs or organizations: Not on file    Relationship status: Not on file  . Intimate partner violence:    Fear of current or ex partner: Not on file    Emotionally abused: Not on file    Physically abused: Not on file    Forced sexual activity: Not on file  Other Topics Concern  . Not on file  Social History Narrative  . Not on file    Family History:  Family History  Problem Relation Age of Onset  . Hypertension Other   . Cancer Mother     Medications Princess Bruins had no medications administered during this visit. Current Outpatient Medications  Medication Sig Dispense Refill  . norgestimate-ethinyl estradiol (SPRINTEC 28) 0.25-35 MG-MCG tablet Take 1 tablet by mouth daily. (Patient not taking: Reported on 05/10/2018) 3 Package 3  . pantoprazole (PROTONIX) 20 MG tablet Take 1 tablet (20 mg total) by mouth daily. (Patient not taking: Reported on 05/10/2018) 30 tablet 1  . promethazine-dextromethorphan (PROMETHAZINE-DM) 6.25-15 MG/5ML syrup Take 5 mLs by mouth 4 (four) times daily as needed for cough. (Patient not taking: Reported on 05/10/2018) 118 mL 0   No current  facility-administered medications for this visit.     Allergies Other   Physical Exam:  BP 130/78   Pulse 90   Wt 173 lb 4.8 oz (78.6 kg)   BMI 28.84 kg/m  Body mass index is 28.84 kg/m. General appearance: Well nourished, well developed female in no acute distress.  Cardiovascular: normal s1 and s2.  No murmurs, rubs or gallops. Respiratory:  Clear to auscultation bilateral. Normal respiratory effort Abdomen: positive bowel sounds and no masses, hernias; diffusely non tender to palpation, non distended Neuro/Psych:  Normal mood and affect.  Skin:  Warm and dry.  Lymphatic:  No inguinal lymphadenopathy.   Pelvic exam: is not limited by body habitus EGBUS: within normal limits, Vagina: within normal limits and with no blood or discharge in the vault, Cervix: normal appearing cervix without tenderness, discharge or lesions. Uterus:  nonenlarged and non tender and Adnexa:  normal adnexa and no mass, fullness, tenderness Rectovaginal: deferred. 1.5-2cm nttp, normal skin appearing and deflated hemorrhoid  Laboratory: UPT negative  Radiology: none  Assessment: pt stable  Plan: *AUB: will get pelvic u/s and bring patient back for in office sonosite evaluation of uterine cavity. Also STI swab sent. Pt told to do OCPs in the mean time and continuous OCP sent in for her in order to see if that helps with her AUB and if she needs the sonosite b/c ocps aren't helping then the OCPs will make the procedure easier. Will also have her get tsh, prl and cbc *GERD: pt w/o h/o esophageal dilation and has GERD on ppi. Recommend talking to PCP about this and about possibly seeing GI again for this and asymptomatic hemorrhoid *Hemorrhoid: see above. I told her since asymptomatic and no constipation I wouldn't worry about it  RTC after u/s for sonosite evaluation.   Cornelia Copa MD Attending [facility])

## 2018-05-10 NOTE — Progress Notes (Signed)
No menstrual cycle since birth control stopped X1 month  Acid reflux issues, spitting up had a medication but didn't work

## 2018-05-11 LAB — CERVICOVAGINAL ANCILLARY ONLY
Bacterial vaginitis: NEGATIVE
CHLAMYDIA, DNA PROBE: NEGATIVE
Candida vaginitis: NEGATIVE
Neisseria Gonorrhea: NEGATIVE
Trichomonas: NEGATIVE

## 2018-05-15 ENCOUNTER — Ambulatory Visit (HOSPITAL_COMMUNITY): Payer: Self-pay

## 2018-05-15 ENCOUNTER — Encounter: Payer: Self-pay | Admitting: Obstetrics and Gynecology

## 2018-08-23 IMAGING — US US PELVIS COMPLETE TRANSABD/TRANSVAG
1 series · 13 of 25 positions shown · non-contrast
Comparison: None

CLINICAL DATA: Initial evaluation for acute pelvic pain,
menorrhagia, irregular cycle.



[Series 1: us pelvis complete transabd/transvag · 0.18mm/px · 13 of 101 slices shown]
[im 1/101]
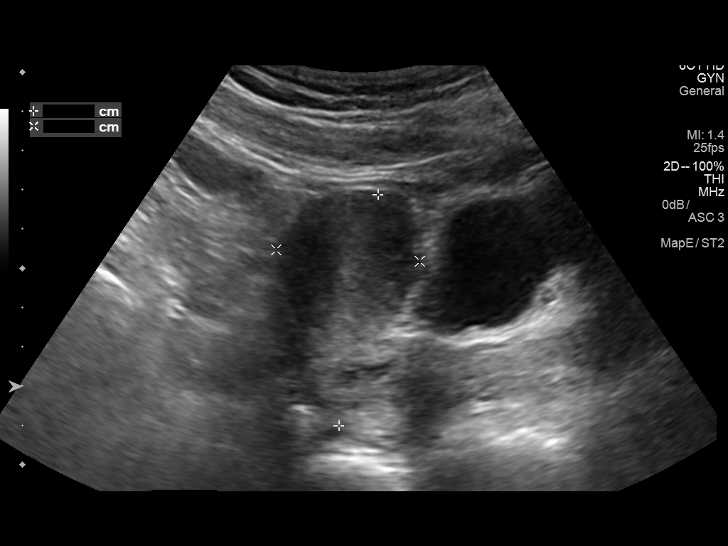
[im 9/101]
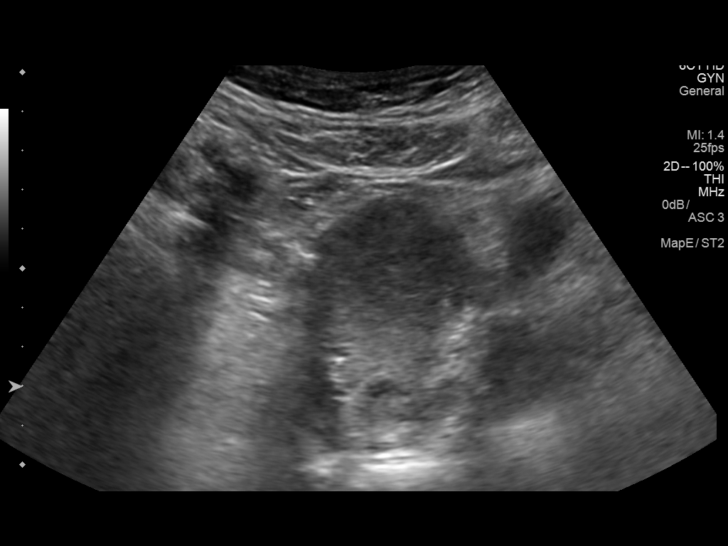
[im 17/101]
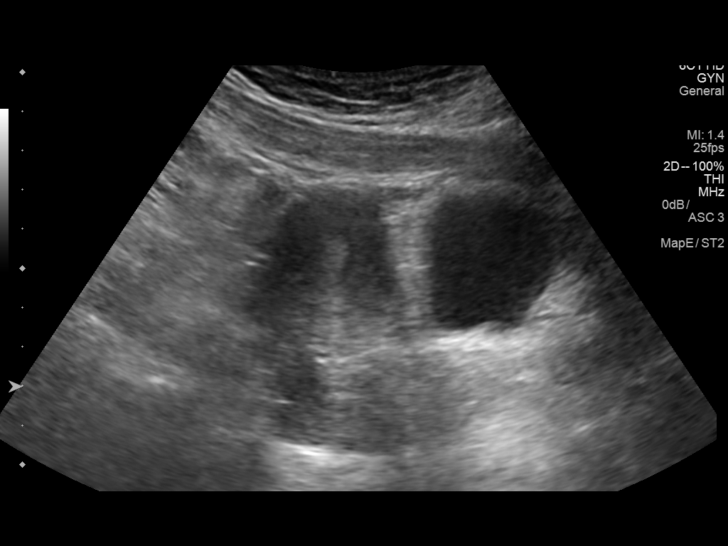
[im 26/101]
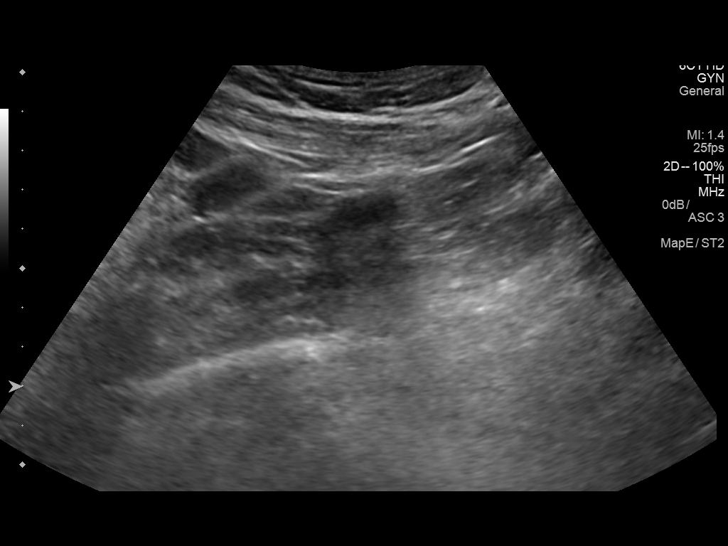
[im 34/101]
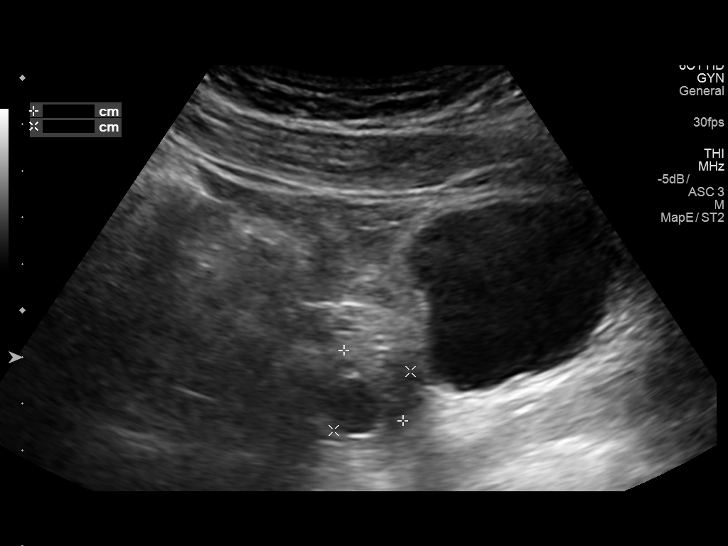
[im 42/101]
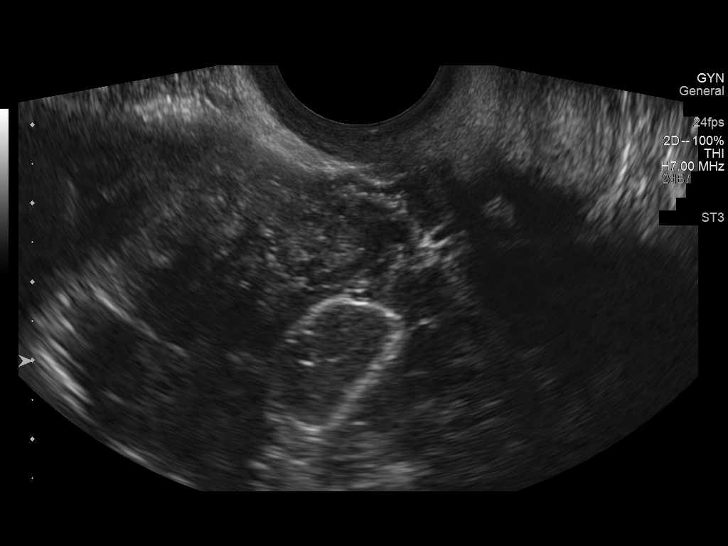
[im 51/101]
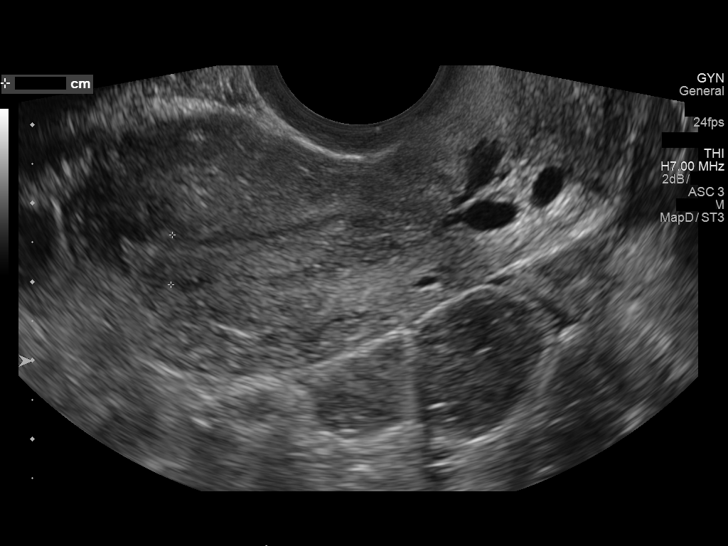
[im 59/101]
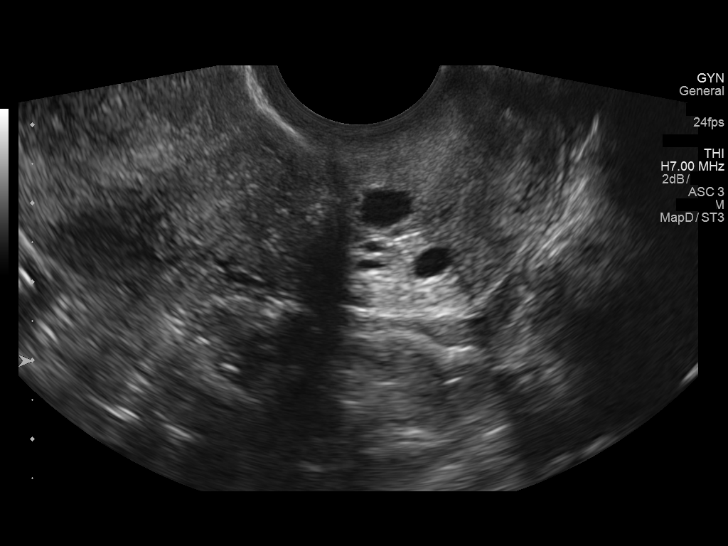
[im 67/101]
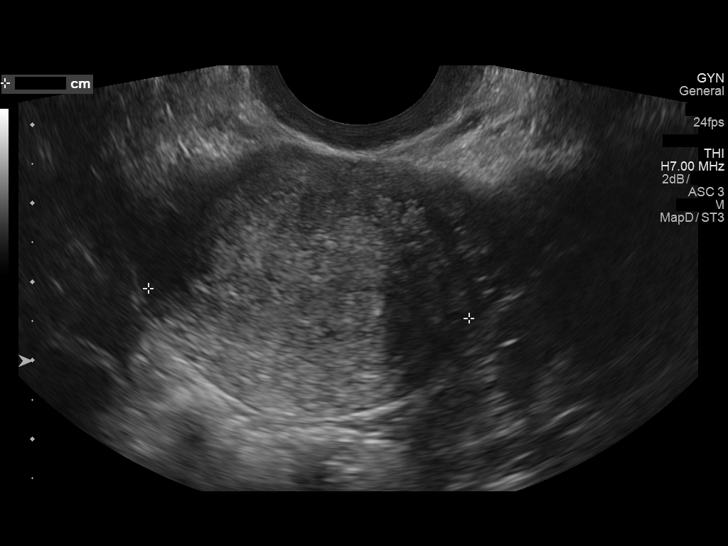
[im 76/101]
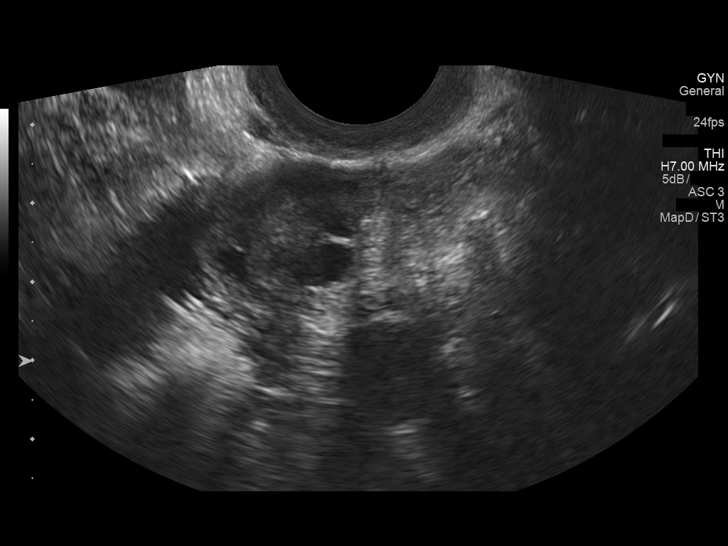
[im 84/101]
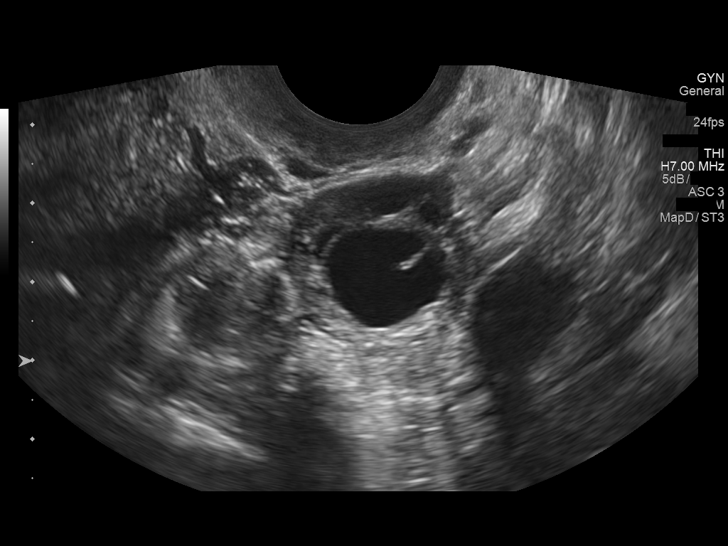
[im 92/101]
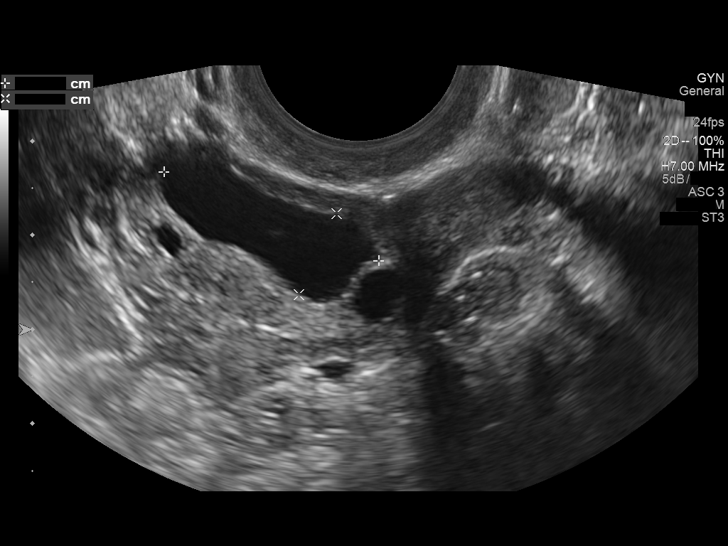
[im 101/101]
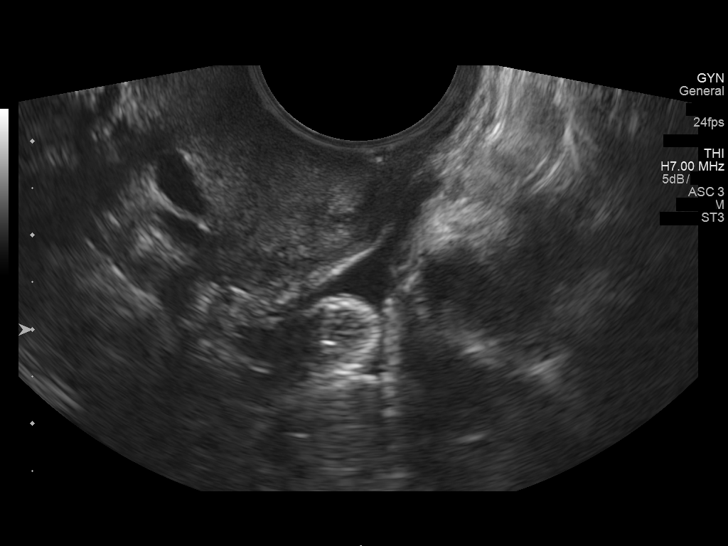

[13 of 25 positions shown; findings below may reference images not displayed]

FINDINGS: Uterus

Measurements: 6.9 x 3.6 x 4.1 cm. No fibroids or other mass
visualized. Few small nabothian cysts noted.

Endometrium

Thickness: 6.3 mm.  No focal abnormality visualized.

Right ovary

Measurements: 3.4 x 1.8 x 2.1 cm. 2.5 x 1.0 x 1.0 cm simple cyst,
most consistent with a normal physiologic cyst. This cyst is
somewhat elongated and likely collapsing.

Left ovary

Measurements: 3.1 x 1.9 x 2.3 cm. 1.5 x 1.4 x 1.5 cm mildly complex
cyst with scattered internal septations, indeterminate, but favored
to reflect a hemorrhagic cyst with retractile clot. No internal
vascularity.

Other findings

Small volume free physiologic fluid within the pelvis.
IMPRESSION: 1. 1.5 cm mildly complex left ovarian cyst, indeterminate, but
favored to reflect a hemorrhagic cyst with retractile clot. A short
interval follow-up ultrasound in 6-12 weeks is suggested to ensure
resolution.
2. No other acute abnormality within the pelvis.
3. Normal sonographic appearance of the uterus and endometrium.
4. Normal right ovary.

## 2018-09-15 ENCOUNTER — Other Ambulatory Visit: Payer: Self-pay

## 2018-09-15 ENCOUNTER — Ambulatory Visit (INDEPENDENT_AMBULATORY_CARE_PROVIDER_SITE_OTHER): Payer: BLUE CROSS/BLUE SHIELD | Admitting: Family Medicine

## 2018-09-15 VITALS — BP 122/68 | HR 84 | Temp 98.4°F | Resp 16 | Ht 65.0 in | Wt 171.0 lb

## 2018-09-15 DIAGNOSIS — Z30013 Encounter for initial prescription of injectable contraceptive: Secondary | ICD-10-CM | POA: Diagnosis not present

## 2018-09-15 DIAGNOSIS — Z3202 Encounter for pregnancy test, result negative: Secondary | ICD-10-CM

## 2018-09-15 DIAGNOSIS — N898 Other specified noninflammatory disorders of vagina: Secondary | ICD-10-CM

## 2018-09-15 LAB — POCT URINE PREGNANCY: Preg Test, Ur: NEGATIVE

## 2018-09-15 MED ORDER — FLUCONAZOLE 150 MG PO TABS: 150.0000 mg | ORAL_TABLET | Freq: Once | ORAL | 0 refills | Status: AC

## 2018-09-15 MED ORDER — METRONIDAZOLE 500 MG PO TABS: 500.0000 mg | ORAL_TABLET | Freq: Two times a day (BID) | ORAL | 0 refills | Status: AC

## 2018-09-15 MED ORDER — MEDROXYPROGESTERONE ACETATE 150 MG/ML IM SUSP
150.0000 mg | Freq: Once | INTRAMUSCULAR | Status: AC
  Administered 2018-09-15: 150 mg via INTRAMUSCULAR

## 2018-09-15 NOTE — Progress Notes (Signed)
[facility]   Progress Note: General Provider: Mike GipAndre Takuya Lariccia, FNP  SUBJECTIVE:   Bonnie Stevenson is a 28 y.o. female who  has a past medical history of Bacterial vaginosis, GERD (gastroesophageal reflux disease), and Ovarian cyst.. Patient presents today for Vaginal Discharge and Contraception Vaginal Discharge The patient's primary symptoms include a genital odor and vaginal discharge. This is a recurrent problem. The current episode started 1 to 4 weeks ago. The problem occurs constantly. The problem has been rapidly worsening. The patient is experiencing no pain. She is not pregnant. Pertinent negatives include no abdominal pain. The vaginal discharge was thin and white. There has been no bleeding. Nothing aggravates the symptoms. She has tried nothing for the symptoms. She is sexually active. No, her partner does not have an STD. She uses progestin injections for contraception.     Review of Systems  Gastrointestinal: Negative for abdominal pain.  Genitourinary: Positive for vaginal discharge.       Vaginal discharge and odor   All other systems reviewed and are negative.    OBJECTIVE: BP 122/68 (BP Location: Left Arm, Patient Position: Sitting, Cuff Size: Normal)   Pulse 84   Temp 98.4 F (36.9 C) (Oral)   Resp 16   Ht 5\' 5"  (1.651 m)   Wt 171 lb (77.6 kg)   LMP 09/13/2018   SpO2 100%   BMI 28.46 kg/m   Wt Readings from Last 3 Encounters:  09/15/18 171 lb (77.6 kg)  05/10/18 173 lb 4.8 oz (78.6 kg)  04/10/18 178 lb (80.7 kg)     Physical Exam Vitals signs and nursing note reviewed.  Constitutional:      General: She is not in acute distress.    Appearance: Normal appearance.  HENT:     Head: Normocephalic and atraumatic.  Eyes:     Extraocular Movements: Extraocular movements intact.     Conjunctiva/sclera: Conjunctivae normal.     Pupils: Pupils are equal, round, and reactive to light.  Cardiovascular:      Rate and Rhythm: Normal rate and regular rhythm.     Heart sounds: No murmur.  Pulmonary:     Effort: Pulmonary effort is normal.     Breath sounds: Normal breath sounds.  Genitourinary:    Comments: Patient to self collect vaginal swab Musculoskeletal: Normal range of motion.  Skin:    General: Skin is warm and dry.  Neurological:     Mental Status: She is alert and oriented to person, place, and time.  Psychiatric:        Mood and Affect: Mood normal.        Behavior: Behavior normal.        Thought Content: Thought content normal.        Judgment: Judgment normal.     ASSESSMENT/PLAN: 1. Encounter for initial prescription of injectable contraceptive - POCT urine pregnancy - medroxyPROGESTERone (DEPO-PROVERA) injection 150 mg  2. Vaginal discharge - NuSwab Vaginitis Plus (VG+) - metroNIDAZOLE (FLAGYL) 500 MG tablet; Take 1 tablet (500 mg total) by mouth 2 (two) times daily for 7 days.  Dispense: 14 tablet; Refill: 0 - fluconazole (DIFLUCAN) 150 MG tablet; Take 1 tablet (150 mg total) by mouth once for 1 dose.  Dispense: 1 tablet; Refill: 0    Return in about 12 weeks (around 12/08/2018), or if symptoms worsen or fail to improve, for Depo shot.    The patient was given clear instructions to go to ER or  return to medical center if symptoms do not improve, worsen or new problems develop. The patient verbalized understanding and agreed with plan of care.   Ms. Doug Sou. Nathaneil Canary, FNP-BC [facility] [address] [place], [place] [address] [phone]

## 2018-09-15 NOTE — Patient Instructions (Signed)
Vaginitis  Vaginitis is irritation and swelling (inflammation) of the vagina. It happens when normal bacteria and yeast in the vagina grow too much. There are many types of this condition. Treatment will depend on the type you have. Follow these instructions at home: Lifestyle  Keep your vagina area clean and dry. ? Avoid using soap. ? Rinse the area with water.  Do not do the following until your doctor says it is okay: ? Wash and clean out the vagina (douche). ? Use tampons. ? Have sex.  Wipe from front to back after going to the bathroom.  Let air reach your vagina. ? Wear cotton underwear. ? Do not wear: ? Underwear while you sleep. ? Tight pants. ? Thong underwear. ? Underwear or nylons without a cotton panel. ? Take off any wet clothing, such as bathing suits, as soon as possible.  Use gentle, non-scented products. Do not use things that can irritate the vagina, such as fabric softeners. Avoid the following products if they are scented: ? Feminine sprays. ? Detergents. ? Tampons. ? Feminine hygiene products. ? Soaps or bubble baths.  Practice safe sex and use condoms. General instructions  Take over-the-counter and prescription medicines only as told by your doctor.  If you were prescribed an antibiotic medicine, take or use it as told by your doctor. Do not stop taking or using the antibiotic even if you start to feel better.  Keep all follow-up visits as told by your doctor. This is important. Contact a doctor if:  You have pain in your belly.  You have a fever.  Your symptoms last for more than 2-3 days. Get help right away if:  You have a fever and your symptoms get worse all of a sudden. Summary  Vaginitis is irritation and swelling of the vagina. It can happen when the normal bacteria and yeast in the vagina grow too much. There are many types.  Treatment will depend on the type you have.  Do not douche, use tampons , or have sex until your health  care provider approves. When you can return to sex, practice safe sex and use condoms. This information is not intended to replace advice given to you by your health care provider. Make sure you discuss any questions you have with your health care provider. Document Released: 05/14/2008 Document Revised: 01/28/2017 Document Reviewed: 03/09/2016 Elsevier Patient Education  2020 Elsevier Inc.  

## 2018-09-29 ENCOUNTER — Telehealth: Payer: Self-pay

## 2018-09-29 NOTE — Telephone Encounter (Signed)
Called, no answer. No voicemail. Called to advised that vaginal swab was postitive for BV and she was treated with medication given at appointment. (lab did not cross over from [place] but are being scanned in.

## 2018-10-20 ENCOUNTER — Encounter (HOSPITAL_COMMUNITY): Payer: Self-pay

## 2018-11-08 ENCOUNTER — Encounter (HOSPITAL_COMMUNITY): Payer: Self-pay | Admitting: *Deleted

## 2018-11-08 ENCOUNTER — Encounter (HOSPITAL_COMMUNITY): Payer: Self-pay

## 2018-12-29 ENCOUNTER — Ambulatory Visit (INDEPENDENT_AMBULATORY_CARE_PROVIDER_SITE_OTHER): Payer: BLUE CROSS/BLUE SHIELD

## 2018-12-29 ENCOUNTER — Other Ambulatory Visit: Payer: Self-pay

## 2018-12-29 DIAGNOSIS — Z3202 Encounter for pregnancy test, result negative: Secondary | ICD-10-CM | POA: Diagnosis not present

## 2018-12-29 DIAGNOSIS — Z3042 Encounter for surveillance of injectable contraceptive: Secondary | ICD-10-CM | POA: Diagnosis not present

## 2018-12-29 LAB — POCT URINE PREGNANCY: Preg Test, Ur: NEGATIVE

## 2018-12-29 MED ORDER — MEDROXYPROGESTERONE ACETATE 150 MG/ML IM SUSP
150.0000 mg | Freq: Once | INTRAMUSCULAR | Status: AC
  Administered 2018-12-29: 14:00:00 150 mg via INTRAMUSCULAR

## 2019-02-26 ENCOUNTER — Ambulatory Visit (INDEPENDENT_AMBULATORY_CARE_PROVIDER_SITE_OTHER): Payer: Medicaid Other | Admitting: Family Medicine

## 2019-02-26 ENCOUNTER — Encounter: Payer: Self-pay | Admitting: Family Medicine

## 2019-02-26 ENCOUNTER — Other Ambulatory Visit: Payer: Self-pay

## 2019-02-26 VITALS — BP 138/97 | HR 83 | Temp 97.7°F | Ht 65.0 in | Wt 170.6 lb

## 2019-02-26 DIAGNOSIS — Z09 Encounter for follow-up examination after completed treatment for conditions other than malignant neoplasm: Secondary | ICD-10-CM

## 2019-02-26 DIAGNOSIS — Z7251 High risk heterosexual behavior: Secondary | ICD-10-CM

## 2019-02-26 DIAGNOSIS — Z202 Contact with and (suspected) exposure to infections with a predominantly sexual mode of transmission: Secondary | ICD-10-CM

## 2019-02-26 DIAGNOSIS — Z3042 Encounter for surveillance of injectable contraceptive: Secondary | ICD-10-CM

## 2019-02-26 DIAGNOSIS — Z3202 Encounter for pregnancy test, result negative: Secondary | ICD-10-CM

## 2019-02-26 DIAGNOSIS — N898 Other specified noninflammatory disorders of vagina: Secondary | ICD-10-CM | POA: Insufficient documentation

## 2019-02-26 DIAGNOSIS — K219 Gastro-esophageal reflux disease without esophagitis: Secondary | ICD-10-CM | POA: Insufficient documentation

## 2019-02-26 LAB — POCT URINALYSIS DIPSTICK
Bilirubin, UA: NEGATIVE
Blood, UA: NEGATIVE
Glucose, UA: NEGATIVE
Ketones, UA: NEGATIVE
Nitrite, UA: NEGATIVE
Protein, UA: NEGATIVE
Spec Grav, UA: 1.02 (ref 1.010–1.025)
Urobilinogen, UA: 0.2 E.U./dL
pH, UA: 7 (ref 5.0–8.0)

## 2019-02-26 LAB — POCT URINE PREGNANCY: Preg Test, Ur: NEGATIVE

## 2019-02-26 MED ORDER — MEDROXYPROGESTERONE ACETATE 150 MG/ML IM SUSP
150.0000 mg | Freq: Once | INTRAMUSCULAR | Status: AC
  Administered 2019-02-26: 16:00:00 150 mg via INTRAMUSCULAR

## 2019-02-26 NOTE — Progress Notes (Signed)
[facility]   Sick Visit  Subjective:  Patient ID: Bonnie Stevenson, female    DOB: 03-31-1990  Age: 28 y.o. MRN: [id number]  CC:  Chief Complaint  Patient presents with  . Vaginal Discharge    Yellow discharge with fish odor, abd pain    HPI Bonnie Stevenson is a 28 year old female who presents for Sick Visit.  Past Medical History:  Diagnosis Date  . Bacterial vaginosis   . GERD (gastroesophageal reflux disease)   . Ovarian cyst    Current Status: This will be [name]'s initial office visit with me. She was previously seeing Mike Gip, NP for her PCP needs. Since her last office visit, she has c/o vaginal discharge,ordor, and suprapubic pain/discomfort X 1 week now. She denies urinary frequency, dysuria, urinary itching, burning, odor, hematuria. She has not used any OTC products for relief of symptoms. She denies fevers, chills, fatigue, recent infections, weight loss, and night sweats. She has not had any headaches, visual changes, dizziness, and falls. No chest pain, heart palpitations, cough and shortness of breath reported. No reports of GI problems such as nausea, vomiting, diarrhea, and constipation. She has no reports of blood in stools, dysuria and hematuria. No depression or anxiety, and denies suicidal ideations, homicidal ideations, or auditory hallucinations. She denies pain today.   Past Surgical History:  Procedure Laterality Date  . ESOPHAGEAL DILATION      Family History  Problem Relation Age of Onset  . Hypertension Other   . Cancer Mother     Social History   Socioeconomic History  . Marital status: Single    Spouse name: Not on file  . Number of children: Not on file  . Years of education: Not on file  . Highest education level: Not on file  Occupational History  . Not on file  Tobacco Use  . Smoking status: Former Smoker    Packs/day: 0.50    Types: Cigarettes  .  Smokeless tobacco: Never Used  Substance and Sexual Activity  . Alcohol use: No  . Drug use: No  . Sexual activity: Yes    Birth control/protection: None  Other Topics Concern  . Not on file  Social History Narrative  . Not on file   Social Determinants of Health   Financial Resource Strain:   . Difficulty of Paying Living Expenses: Not on file  Food Insecurity:   . Worried About Programme researcher, broadcasting/film/video in the Last Year: Not on file  . Ran Out of Food in the Last Year: Not on file  Transportation Needs:   . Lack of Transportation (Medical): Not on file  . Lack of Transportation (Non-Medical): Not on file  Physical Activity:   . Days of Exercise per Week: Not on file  . Minutes of Exercise per Session: Not on file  Stress:   . Feeling of Stress : Not on file  Social Connections:   . Frequency of Communication with Friends and Family: Not on file  . Frequency of Social Gatherings with Friends and Family: Not on file  . Attends Religious Services: Not on file  . Active Member of Clubs or Organizations: Not on file  . Attends Banker Meetings: Not on file  . Marital Status: Not on file  Intimate Partner Violence:   . Fear of Current or Ex-Partner: Not on file  . Emotionally Abused: Not on file  . Physically Abused: Not on  file  . Sexually Abused: Not on file    Outpatient Medications Prior to Visit  Medication Sig Dispense Refill  . pantoprazole (PROTONIX) 20 MG tablet Take 1 tablet (20 mg total) by mouth daily. (Patient not taking: Reported on 02/26/2019) 30 tablet 1  . Levonorgest-Eth Est & Eth Est 42-21-21-7 DAYS TABS Take 1 tablet by mouth daily. (Patient not taking: Reported on 09/15/2018) 91 tablet 3  . norgestimate-ethinyl estradiol (SPRINTEC 28) 0.25-35 MG-MCG tablet Take 1 tablet by mouth daily. (Patient not taking: Reported on 05/10/2018) 3 Package 3  . promethazine-dextromethorphan (PROMETHAZINE-DM) 6.25-15 MG/5ML syrup Take 5 mLs by mouth 4 (four) times  daily as needed for cough. (Patient not taking: Reported on 05/10/2018) 118 mL 0   No facility-administered medications prior to visit.    Allergies  Allergen Reactions  . Other Other (See Comments)    "I took some pills for a bladder infection one time and they almost killed me"  Cannot remember name    ROS Review of Systems  Constitutional: Negative.   HENT: Negative.   Eyes: Negative.   Respiratory: Negative.   Cardiovascular: Negative.   Gastrointestinal: Negative.   Endocrine: Negative.   Genitourinary: Positive for vaginal discharge.       Vaginal odor.   Musculoskeletal: Negative.   Skin: Negative.   Allergic/Immunologic: Negative.   Neurological: Negative.   Hematological: Negative.   Psychiatric/Behavioral: Negative.    Objective:    Physical Exam  Constitutional: She is oriented to person, place, and time. She appears well-developed and well-nourished.  HENT:  Head: Normocephalic and atraumatic.  Eyes: Conjunctivae are normal.  Cardiovascular: Normal rate, regular rhythm, normal heart sounds and intact distal pulses.  Pulmonary/Chest: Effort normal and breath sounds normal.  Abdominal: Soft. Bowel sounds are normal.  Musculoskeletal:        General: Normal range of motion.     Cervical back: Normal range of motion and neck supple.  Neurological: She is alert and oriented to person, place, and time. She has normal reflexes.  Skin: Skin is warm and dry.  Psychiatric: She has a normal mood and affect. Her behavior is normal. Judgment and thought content normal.  Nursing note and vitals reviewed.   BP (!) 138/97   Pulse 83   Temp 97.7 F (36.5 C) (Oral)   Ht 5\' 5"  (1.651 m)   Wt 170 lb 9.6 oz (77.4 kg)   SpO2 97%   BMI 28.39 kg/m  Wt Readings from Last 3 Encounters:  02/26/19 170 lb 9.6 oz (77.4 kg)  09/15/18 171 lb (77.6 kg)  05/10/18 173 lb 4.8 oz (78.6 kg)     There are no preventive care reminders to display for this patient.  There are no  preventive care reminders to display for this patient.  No results found for: TSH Lab Results  Component Value Date   WBC 4.9 07/20/2015   HGB 9.9 (L) 07/20/2015   HCT 31.4 (L) 07/20/2015   MCV 76.0 (L) 07/20/2015   PLT 368 07/20/2015   Lab Results  Component Value Date   NA 142 06/19/2015   K 3.8 06/19/2015   CO2 24 06/19/2015   GLUCOSE 66 06/19/2015   BUN 17 06/19/2015   CREATININE 0.76 06/19/2015   BILITOT 0.1 (L) 06/19/2015   ALKPHOS 68 06/19/2015   AST 18 06/19/2015   ALT 14 06/19/2015   PROT 7.5 06/19/2015   ALBUMIN 4.4 06/19/2015   CALCIUM 9.2 06/19/2015   ANIONGAP 7 06/19/2015   No  results found for: CHOL No results found for: HDL No results found for: LDLCALC No results found for: TRIG No results found for: CHOLHDL No results found for: [id number]  Assessment & Plan:   1. Vaginal discharge - NuSwab Vaginitis Plus (VG+) - Urinalysis Dipstick  2. Gastroesophageal reflux disease without esophagitis - H. pylori breath test  3. Possible exposure to STD - RPR - HSV(herpes smplx)abs-1+2(IgG+IgM)-bld - HepB+HepC+HIV Panel - Ct/GC NAA, Pharyngeal  4. Encounter for management and injection of depo-Provera - medroxyPROGESTERone (DEPO-PROVERA) injection 150 mg - POCT urine pregnancy  5. Follow up She will follow up in 3 months.   Meds ordered this encounter  Medications  . medroxyPROGESTERone (DEPO-PROVERA) injection 150 mg    Orders Placed This Encounter  Procedures  . Ct/GC NAA, Pharyngeal  . NuSwab Vaginitis Plus (VG+)  . H. pylori breath test  . RPR  . HSV(herpes smplx)abs-1+2(IgG+IgM)-bld  . HepB+HepC+HIV Panel  . Urinalysis Dipstick  . POCT urine pregnancy    Referral Orders  No referral(s) requested today    Raliegh IpNatalie Jeanann Balinski,  MSN, FNP-BC [facility] [address]  [place], [place] [id number] [phone]  Problem List Items Addressed This Visit     None    Visit Diagnoses    Vaginal discharge    -  Primary   Relevant Orders   NuSwab Vaginitis Plus (VG+)   Urinalysis Dipstick (Completed)   Exposure to STD       Relevant Orders   NuSwab Vaginitis Plus (VG+)   Urinalysis Dipstick (Completed)   Gastroesophageal reflux disease without esophagitis       Relevant Orders   H. pylori breath test   Possible exposure to STD       Relevant Orders   RPR   HSV(herpes smplx)abs-1+2(IgG+IgM)-bld   HepB+HepC+HIV Panel   Ct/GC NAA, Pharyngeal   Follow up       Encounter for management and injection of depo-Provera       Relevant Medications   medroxyPROGESTERone (DEPO-PROVERA) injection 150 mg (Completed)   Unprotected sexual intercourse       Relevant Orders   POCT urine pregnancy (Completed)      Meds ordered this encounter  Medications  . medroxyPROGESTERone (DEPO-PROVERA) injection 150 mg    Follow-up: No follow-ups on file.    Kallie LocksNatalie M Marianne Golightly, FNP

## 2019-02-28 ENCOUNTER — Telehealth: Payer: Self-pay | Admitting: Family Medicine

## 2019-02-28 LAB — NUSWAB VAGINITIS PLUS (VG+)
Atopobium vaginae: HIGH Score — AB
Candida albicans, NAA: NEGATIVE
Candida glabrata, NAA: NEGATIVE
Chlamydia trachomatis, NAA: NEGATIVE
Megasphaera 1: HIGH Score — AB
Neisseria gonorrhoeae, NAA: NEGATIVE
Trich vag by NAA: NEGATIVE

## 2019-02-28 LAB — HEPB+HEPC+HIV PANEL
HIV Screen 4th Generation wRfx: NONREACTIVE
Hep B C IgM: NEGATIVE
Hep B Core Total Ab: NEGATIVE
Hep B E Ab: NEGATIVE
Hep B E Ag: NEGATIVE
Hep B Surface Ab, Qual: REACTIVE
Hep C Virus Ab: <0.1 s/co ratio (ref 0.0–0.9)
Hepatitis B Surface Ag: NEGATIVE

## 2019-02-28 LAB — RPR: RPR Ser Ql: NONREACTIVE

## 2019-02-28 LAB — H. PYLORI BREATH TEST: H pylori Breath Test: NEGATIVE

## 2019-02-28 LAB — CT/GC NAA, PHARYNGEAL
C TRACH RRNA NPH QL PCR: NEGATIVE
N GONORRHOEA RRNA NPH QL PCR: NEGATIVE

## 2019-02-28 LAB — HSV(HERPES SMPLX)ABS-I+II(IGG+IGM)-BLD
HSV 1 Glycoprotein G Ab, IgG: 47.4 index — ABNORMAL HIGH (ref 0.00–0.90)
HSV 2 IgG, Type Spec: <0.91 index (ref 0.00–0.90)
HSVI/II Comb IgM: <0.91 Ratio (ref 0.00–0.90)

## 2019-02-28 NOTE — Telephone Encounter (Signed)
Patient called for recent test results. She has been advised we will call her as soon as they are completed.

## 2019-03-01 ENCOUNTER — Other Ambulatory Visit: Payer: Self-pay | Admitting: Family Medicine

## 2019-03-01 DIAGNOSIS — B9689 Other specified bacterial agents as the cause of diseases classified elsewhere: Secondary | ICD-10-CM

## 2019-03-01 MED ORDER — METRONIDAZOLE 500 MG PO TABS: 500.0000 mg | ORAL_TABLET | Freq: Two times a day (BID) | ORAL | 0 refills | Status: AC

## 2019-05-28 ENCOUNTER — Ambulatory Visit: Payer: Medicaid Other | Admitting: Family Medicine

## 2021-03-10 DIAGNOSIS — B9689 Other specified bacterial agents as the cause of diseases classified elsewhere: Secondary | ICD-10-CM | POA: Diagnosis not present

## 2021-03-10 DIAGNOSIS — N760 Acute vaginitis: Secondary | ICD-10-CM | POA: Diagnosis not present

## 2021-03-17 DIAGNOSIS — L509 Urticaria, unspecified: Secondary | ICD-10-CM | POA: Diagnosis not present

## 2021-04-03 ENCOUNTER — Other Ambulatory Visit: Payer: Self-pay

## 2021-04-03 ENCOUNTER — Other Ambulatory Visit (HOSPITAL_COMMUNITY)
Admission: RE | Admit: 2021-04-03 | Discharge: 2021-04-03 | Disposition: A | Discharge status: Home / Self Care | Payer: BC Managed Care – PPO | Source: Ambulatory Visit | Attending: Obstetrics and Gynecology | Admitting: Obstetrics and Gynecology

## 2021-04-03 ENCOUNTER — Ambulatory Visit (INDEPENDENT_AMBULATORY_CARE_PROVIDER_SITE_OTHER): Payer: BC Managed Care – PPO | Admitting: Obstetrics and Gynecology

## 2021-04-03 ENCOUNTER — Other Ambulatory Visit: Payer: Self-pay | Admitting: Obstetrics and Gynecology

## 2021-04-03 VITALS — BP 122/81 | HR 83 | Ht 63.0 in | Wt 186.0 lb

## 2021-04-03 DIAGNOSIS — Z01419 Encounter for gynecological examination (general) (routine) without abnormal findings: Secondary | ICD-10-CM

## 2021-04-03 DIAGNOSIS — N760 Acute vaginitis: Secondary | ICD-10-CM | POA: Diagnosis not present

## 2021-04-03 DIAGNOSIS — K649 Unspecified hemorrhoids: Secondary | ICD-10-CM

## 2021-04-03 DIAGNOSIS — B9689 Other specified bacterial agents as the cause of diseases classified elsewhere: Secondary | ICD-10-CM

## 2021-04-03 MED ORDER — PANTOPRAZOLE SODIUM 40 MG PO TBEC: 40.0000 mg | DELAYED_RELEASE_TABLET | Freq: Every day | ORAL | 1 refills | Status: DC

## 2021-04-03 MED ORDER — TINIDAZOLE 500 MG PO TABS: 500.0000 mg | ORAL_TABLET | Freq: Two times a day (BID) | ORAL | 0 refills | Status: DC

## 2021-04-03 MED ORDER — FLUCONAZOLE 150 MG PO TABS: 150.0000 mg | ORAL_TABLET | Freq: Every day | ORAL | 2 refills | Status: DC

## 2021-04-03 MED ORDER — METRONIDAZOLE 0.75 % VA GEL: 1.0000 | VAGINAL | 6 refills | Status: DC

## 2021-04-03 NOTE — Progress Notes (Signed)
Pt c/o recurrent BV

## 2021-04-03 NOTE — Progress Notes (Signed)
GYNECOLOGY ANNUAL PREVENTATIVE CARE ENCOUNTER NOTE  History:     Bonnie Stevenson is a 31 y.o. G0P0000 female here for a routine annual gynecologic exam.  Current complaints: recurrent BV. Has been treated more than 5 x this year. Is sexually active with partner, has intercourse daily.    Denies abnormal vaginal bleeding, pelvic pain, problems with intercourse or other gynecologic concerns. Desires pregnancy, concerned that she is unable to become pregnant. Had recent STI testing done.   Family history of breast cancer; mother, alive.  GERD, TUMs not helping. No vomiting.    Gynecologic History Patient's last menstrual period was 03/20/2021. Contraception: none Last Pap: unknown  Last Mammogram: NA Last Colonoscopy: NA  Obstetric History OB History  Gravida Para Term Preterm AB Living  0 0 0 0 0 0  SAB IAB Ectopic Multiple Live Births  0 0 0 0 0    Past Medical History:  Diagnosis Date   Bacterial vaginosis    GERD (gastroesophageal reflux disease)    Ovarian cyst     Past Surgical History:  Procedure Laterality Date   ESOPHAGEAL DILATION      No current outpatient medications on file prior to visit.   No current facility-administered medications on file prior to visit.    Allergies  Allergen Reactions   Other Other (See Comments)    "I took some pills for a bladder infection one time and they almost killed me"  Cannot remember name   Shrimp Extract Allergy Skin Test Other (See Comments)    unknown    Social History:  reports that she has quit smoking. Her smoking use included cigarettes. She smoked an average of .5 packs per day. She has never used smokeless tobacco. She reports that she does not drink alcohol and does not use drugs.  Family History  Problem Relation Age of Onset   Hypertension Other    Cancer Mother     The following portions of the patient's history were reviewed and updated as appropriate: allergies, current medications, past  family history, past medical history, past social history, past surgical history and problem list.  Review of Systems Pertinent items noted in HPI and remainder of comprehensive ROS otherwise negative.  Physical Exam:  BP 122/81    Pulse 83    Ht 5\' 3"  (1.6 m)    Wt 186 lb (84.4 kg)    LMP 03/20/2021    BMI 32.95 kg/m  CONSTITUTIONAL: Well-developed, well-nourished female in no acute distress.  HENT:  Normocephalic, atraumatic, External right and left ear normal.  EYES: Conjunctivae and EOM are normal. Pupils are equal, round, and reactive to light. No scleral icterus.  NECK: Normal range of motion, supple, no masses.  Normal thyroid.  SKIN: Skin is warm and dry. No rash noted. Not diaphoretic. No erythema. No pallor. MUSCULOSKELETAL: Normal range of motion. No tenderness.  No cyanosis, clubbing, or edema. NEUROLOGIC: Alert and oriented to person, place, and time. Normal reflexes, muscle tone coordination.  PSYCHIATRIC: Normal mood and affect. Normal behavior. Normal judgment and thought content. CARDIOVASCULAR: Normal heart rate noted, regular rhythm RESPIRATORY: Clear to auscultation bilaterally. Effort and breath sounds normal, no problems with respiration noted. BREASTS: Symmetric in size. No masses, tenderness, skin changes, nipple drainage, or lymphadenopathy bilaterally. Performed in the presence of a chaperone. ABDOMEN: Soft, no distention noted.  No tenderness, rebound or guarding.  PELVIC: Normal appearing external genitalia and urethral meatus; normal appearing vaginal mucosa and cervix.  No abnormal vaginal  discharge noted.  Pap smear obtained.  Normal uterine size, no other palpable masses, no uterine or adnexal tenderness. External hemorrhoid noted, not thrombosed.  Performed in the presence of a chaperone.   Assessment and Plan:  1. BV (bacterial vaginosis)  - Cytology - PAP( [place]) - Cervicovaginal ancillary only( [place]) - Protonix   1. BV (bacterial  vaginosis)  - Cytology - PAP( [place]) - Cervicovaginal ancillary only( [place]) - HgB A1c - Thyroid Panel With TSH - Tindamax x 7 days then metrol gel 2x week for 6 months.   3. Hemorrhoids, unspecified hemorrhoid type  - Ambulatory referral to General Surgery  - Not painful at all.    Will follow up results of pap smear and manage accordingly. Mammogram @ age 31 Routine preventative health maintenance measures emphasized. Please refer to After Visit Summary for other counseling recommendations.      Hatcher Froning, Artist Pais, [place] for [employer], [place]

## 2021-04-04 LAB — THYROID PANEL WITH TSH
Free Thyroxine Index: 1.5 (ref 1.4–3.8)
T3 Uptake: 29 % (ref 22–35)
T4, Total: 5.3 ug/dL (ref 5.1–11.9)
TSH: 1.38 mIU/L

## 2021-04-04 LAB — HEMOGLOBIN A1C
Hgb A1c MFr Bld: 6.2 % of total Hgb — ABNORMAL HIGH (ref <5.7)
Mean Plasma Glucose: 131 mg/dL
eAG (mmol/L): 7.3 mmol/L

## 2021-04-06 LAB — CERVICOVAGINAL ANCILLARY ONLY
Bacterial Vaginitis (gardnerella): POSITIVE — AB
Candida Glabrata: NEGATIVE
Candida Vaginitis: NEGATIVE
Comment: NEGATIVE
Comment: NEGATIVE
Comment: NEGATIVE

## 2021-04-07 LAB — CYTOLOGY - PAP
Comment: NEGATIVE
Diagnosis: NEGATIVE
High risk HPV: NEGATIVE

## 2021-04-09 ENCOUNTER — Encounter: Payer: Self-pay | Admitting: Obstetrics and Gynecology

## 2021-04-09 DIAGNOSIS — R7303 Prediabetes: Secondary | ICD-10-CM | POA: Insufficient documentation

## 2021-04-12 DIAGNOSIS — L509 Urticaria, unspecified: Secondary | ICD-10-CM | POA: Diagnosis not present

## 2021-05-11 DIAGNOSIS — K643 Fourth degree hemorrhoids: Secondary | ICD-10-CM | POA: Insufficient documentation

## 2021-05-11 DIAGNOSIS — K644 Residual hemorrhoidal skin tags: Secondary | ICD-10-CM | POA: Diagnosis not present

## 2021-06-09 ENCOUNTER — Telehealth: Payer: Self-pay | Admitting: *Deleted

## 2021-06-09 NOTE — Telephone Encounter (Signed)
Left patient an urgent message with scheduled appointment information for Thursday, 06/11/2021 at 1:50 with an 1:35 PM arrival time. ?

## 2021-06-11 ENCOUNTER — Ambulatory Visit: Payer: BC Managed Care – PPO | Admitting: Obstetrics and Gynecology

## 2021-06-12 ENCOUNTER — Other Ambulatory Visit (HOSPITAL_COMMUNITY)
Admission: RE | Admit: 2021-06-12 | Discharge: 2021-06-12 | Disposition: A | Discharge status: Home / Self Care | Payer: BC Managed Care – PPO | Source: Ambulatory Visit | Attending: Women's Health | Admitting: Women's Health

## 2021-06-12 ENCOUNTER — Ambulatory Visit (INDEPENDENT_AMBULATORY_CARE_PROVIDER_SITE_OTHER): Payer: BC Managed Care – PPO | Admitting: Women's Health

## 2021-06-12 ENCOUNTER — Encounter: Payer: Self-pay | Admitting: Women's Health

## 2021-06-12 VITALS — BP 127/80 | HR 71 | Ht 63.0 in | Wt 187.0 lb

## 2021-06-12 DIAGNOSIS — N898 Other specified noninflammatory disorders of vagina: Secondary | ICD-10-CM

## 2021-06-12 DIAGNOSIS — N760 Acute vaginitis: Secondary | ICD-10-CM | POA: Diagnosis not present

## 2021-06-12 DIAGNOSIS — B3731 Acute candidiasis of vulva and vagina: Secondary | ICD-10-CM | POA: Diagnosis not present

## 2021-06-12 NOTE — Progress Notes (Signed)
?  History:  ?Ms. Bonnie Stevenson is a 31 y.o. G0P0000 who presents to clinic today for abnormal vaginal discharge and a fishy-odor. 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 Multiple documented WetPreps/Aptimas + for BV/ClueCells in lab results. Patient was most recently prescribed Tinidazole and Vaginal MetroGel. Reports she only took 5 days of the tinidazole and started the MetroGel during the oral medication course. During that time, she reports she "broke out" in a full body rash while taking those medications. She stopped and the rash went away. Patient does not know if she used clindamycin in the past or had any reaction to it, though she does not believe so. She reports the medications took away her symptoms, but then quickly returned and are worse after sex. ? ?Patient reports daily intercourse without condoms and washing inside the vagina. Patient also reports intercourse during her recent treatment for BV. ? ?Patient desires to self-collect swab today. ? ?The following portions of the patient's history were reviewed and updated as appropriate: allergies, current medications, family history, past medical history, social history, past surgical history and problem list. ? ?Review of Systems:  ?Review of Systems  ?All other systems reviewed and are negative. ? ?  ?Objective:  ?Physical Exam ?BP 127/80   Pulse 71   Ht 5\' 3"  (1.6 m)   Wt 187 lb (84.8 kg)   BMI 33.13 kg/m?  ? ?Physical Exam ?Vitals and nursing note reviewed.  ?Constitutional:   ?   General: She is not in acute distress. ?   Appearance: Normal appearance. She is not ill-appearing, toxic-appearing or diaphoretic.  ?HENT:  ?   Head: Normocephalic and atraumatic.  ?Pulmonary:  ?   Effort: Pulmonary effort is normal.  ?Neurological:  ?   Mental Status: She is alert and oriented to person, place, and time.  ?Psychiatric:     ?   Mood and Affect: Mood normal.     ?   Behavior: Behavior  normal.     ?   Thought Content: Thought content normal.     ?   Judgment: Judgment normal.  ? ?Labs and Imaging ?No results found for this or any previous visit (from the past 24 hour(s)). ? ?No results found. ? ?Health Maintenance Due  ?Topic Date Due  ? COVID-19 Vaccine (1) Never done  ? ?Assessment & Plan:  ? ?1. Vaginal discharge ?- Cervicovaginal ancillary only( ) ? ?2. Vaginal odor ?-discussed possible treatments for recurrent BV including Boric Acid - pt states she was told she would get boric acid next for treatment by previous provider, but was not listed in note ?-pt advised needs vaginal rest until infection has resolved and to engage in other sex aside from vaginal sex, if possible ?-also advised use of condoms without spermicide, not washing inside the vagina and considering increasing consumption or yogurt/probiotics ?-pt advised would speak with other provider and call her later today with plan ?-called patient and left HIPAA compliant voicemail and sent patient MyChart message requesting return phone call to [facility] ?-plan for vaginal clindamycin for treatment ? ? ?Approximately 15 minutes of total time was spent with this patient on counseling and coordination of care. ? ? , NP ?06/12/2021 ?10:33 AM ? ?

## 2021-06-15 ENCOUNTER — Other Ambulatory Visit: Payer: Self-pay

## 2021-06-15 LAB — CERVICOVAGINAL ANCILLARY ONLY
Bacterial Vaginitis (gardnerella): POSITIVE — AB
Candida Glabrata: NEGATIVE
Candida Vaginitis: POSITIVE — AB
Comment: NEGATIVE
Comment: NEGATIVE
Comment: NEGATIVE

## 2021-06-17 ENCOUNTER — Other Ambulatory Visit: Payer: Self-pay | Admitting: Medical

## 2021-06-17 ENCOUNTER — Other Ambulatory Visit: Payer: Self-pay | Admitting: *Deleted

## 2021-06-17 ENCOUNTER — Telehealth: Payer: Self-pay | Admitting: Women's Health

## 2021-06-17 MED ORDER — FLUCONAZOLE 150 MG PO TABS: 150.0000 mg | ORAL_TABLET | Freq: Once | ORAL | 0 refills | Status: AC

## 2021-06-17 MED ORDER — METRONIDAZOLE 500 MG PO TABS: 500.0000 mg | ORAL_TABLET | Freq: Two times a day (BID) | ORAL | 0 refills | Status: DC

## 2021-06-17 MED ORDER — CLINDAMYCIN PHOSPHATE 2 % VA CREA: TOPICAL_CREAM | VAGINAL | 0 refills | Status: DC

## 2021-06-17 NOTE — Telephone Encounter (Signed)
Patient called for third time upset that her treatment has not been sent in yet.  Previous messages sent to provider for Rx guidance.  ? ?Flagyl and Diflucan routed to pharmacy per protocol.  ? ? ?

## 2021-06-17 NOTE — Progress Notes (Signed)
Per J Wenzel,PA-C Clindamycin 2 % vaginal cream sent to pharmacy to use @ bedtime for 7 nights.  Per the chart pt can also use Boric Acid supp which can be purchased at [facility] ?

## 2021-06-20 ENCOUNTER — Encounter: Payer: Self-pay | Admitting: Women's Health

## 2021-09-09 NOTE — Progress Notes (Unsigned)
GYNECOLOGY OFFICE VISIT NOTE  History:   Bonnie Stevenson is a 31 y.o. G0P0000 here today for work up for BV. She has had it in the past. She notes vaginal itching. She has a reaction to flagyl - hives. She had tinidazole in 2019 - can't recall if she had any issues with that. She tried clindamycin and no improvement with that. She has not yet tried boric acid.   She is cautious as far as soaps and detergents. She doesn't use wipes.   She denies any abnormal vaginal discharge, bleeding, pelvic pain or other concerns.     Past Medical History:  Diagnosis Date   Bacterial vaginosis    GERD (gastroesophageal reflux disease)    Ovarian cyst     Past Surgical History:  Procedure Laterality Date   ESOPHAGEAL DILATION      The following portions of the patient's history were reviewed and updated as appropriate: allergies, current medications, past family history, past medical history, past social history, past surgical history and problem list.   Health Maintenance:   Diagnosis  Date Value Ref Range Status  04/03/2021   Final   - Negative for intraepithelial lesion or malignancy (NILM)   Review of Systems:  Pertinent items noted in HPI and remainder of comprehensive ROS otherwise negative.  Physical Exam:  BP 127/78   Pulse 80   Ht 5\' 3"  (1.6 m)   Wt 180 lb (81.6 kg)   LMP 08/19/2021   BMI 31.89 kg/m  CONSTITUTIONAL: Well-developed, well-nourished female in no acute distress.  HEENT:  Normocephalic, atraumatic. External right and left ear normal. No scleral icterus.  NECK: Normal range of motion, supple, no masses noted on observation SKIN: No rash noted. Not diaphoretic. No erythema. No pallor. MUSCULOSKELETAL: Normal range of motion. No edema noted. NEUROLOGIC: Alert and oriented to person, place, and time. Normal muscle tone coordination. No cranial nerve deficit noted. PSYCHIATRIC: Normal mood and affect. Normal behavior. Normal judgment and thought  content.  CARDIOVASCULAR: Normal heart rate noted RESPIRATORY: Effort and breath sounds normal, no problems with respiration noted ABDOMEN: No masses noted. No other overt distention noted.    PELVIC: Normal appearing external genitalia; normal urethral meatus; normal appearing vaginal mucosa - thick white discharge. Performed in the presence of a chaperone  Labs and Imaging No results found for this or any previous visit (from the past 168 hour(s)). No results found.  Assessment and Plan:   1. Vaginal discharge Likely recurrent BV. Gets yeast infections after. She would like to try tinidazole. She will then do boric acid. She wasn't sure what kind to buy. Will submit to pharmacy so if not covered by insurance (we discussed most likely won't be), she will still then know what dose to buy. Reviewed condom use with intercourse may help as well.  -     Cervicovaginal ancillary only( [place]) -     tinidazole (TINDAMAX) 500 MG tablet; Take 4 tablets (2,000 mg total) by mouth daily with breakfast. For two days -     fluconazole (DIFLUCAN) 150 MG tablet; Take 1 tablet (150 mg total) by mouth every 3 (three) days. For three doses -     Boric Acid CRYS; Place 600 mg vaginally at bedtime. Use vaginally every night for two weeks then twice a week    Routine preventative health maintenance measures emphasized. Please refer to After Visit Summary for other counseling recommendations.   Return in about 7 months (around 04/04/2022) for annual.  Radene Gunning, MD, [name] for [facility], [place]

## 2021-09-10 ENCOUNTER — Ambulatory Visit (INDEPENDENT_AMBULATORY_CARE_PROVIDER_SITE_OTHER): Payer: BC Managed Care – PPO | Admitting: Obstetrics and Gynecology

## 2021-09-10 ENCOUNTER — Encounter: Payer: Self-pay | Admitting: Obstetrics and Gynecology

## 2021-09-10 ENCOUNTER — Other Ambulatory Visit (HOSPITAL_COMMUNITY)
Admission: RE | Admit: 2021-09-10 | Discharge: 2021-09-10 | Disposition: A | Discharge status: Home / Self Care | Payer: BC Managed Care – PPO | Source: Ambulatory Visit | Attending: Obstetrics and Gynecology | Admitting: Obstetrics and Gynecology

## 2021-09-10 VITALS — BP 127/78 | HR 80 | Ht 63.0 in | Wt 180.0 lb

## 2021-09-10 DIAGNOSIS — N898 Other specified noninflammatory disorders of vagina: Secondary | ICD-10-CM | POA: Insufficient documentation

## 2021-09-10 MED ORDER — BORIC ACID CRYS: 600.0000 mg | CRYSTALS | Freq: Every day | 5 refills | Status: AC

## 2021-09-10 MED ORDER — FLUCONAZOLE 150 MG PO TABS: 150.0000 mg | ORAL_TABLET | ORAL | 3 refills | Status: AC

## 2021-09-10 MED ORDER — TINIDAZOLE 500 MG PO TABS
2.0000 g | ORAL_TABLET | Freq: Every day | ORAL | 2 refills | Status: AC
Start: 2021-09-10 — End: ?

## 2021-09-10 NOTE — Addendum Note (Signed)
Addended by: Milas Hock A on: 09/10/2021 09:49 AM   Modules accepted: Level of Service

## 2021-09-11 LAB — CERVICOVAGINAL ANCILLARY ONLY
Bacterial Vaginitis (gardnerella): POSITIVE — AB
Candida Glabrata: NEGATIVE
Candida Vaginitis: POSITIVE — AB
Comment: NEGATIVE
Comment: NEGATIVE
Comment: NEGATIVE

## 2022-05-20 ENCOUNTER — Ambulatory Visit: Payer: BC Managed Care – PPO | Admitting: Obstetrics and Gynecology

## 2022-05-31 ENCOUNTER — Ambulatory Visit: Payer: BC Managed Care – PPO | Admitting: Obstetrics & Gynecology
